# Patient Record
Sex: Female | Born: 1980 | Race: White | Hispanic: No | Marital: Married | Smoking: Current every day smoker
Health system: Southern US, Community
[De-identification: ages and names within clinical notes are randomized; demographics above are authoritative.]

## PROBLEM LIST (undated history)

## (undated) DIAGNOSIS — F99 Mental disorder, not otherwise specified: Secondary | ICD-10-CM

## (undated) DIAGNOSIS — F32A Depression, unspecified: Secondary | ICD-10-CM

## (undated) DIAGNOSIS — F329 Major depressive disorder, single episode, unspecified: Secondary | ICD-10-CM

## (undated) DIAGNOSIS — Z765 Malingerer [conscious simulation]: Secondary | ICD-10-CM

## (undated) DIAGNOSIS — G8929 Other chronic pain: Secondary | ICD-10-CM

## (undated) DIAGNOSIS — M549 Dorsalgia, unspecified: Secondary | ICD-10-CM

## (undated) DIAGNOSIS — F419 Anxiety disorder, unspecified: Secondary | ICD-10-CM

## (undated) HISTORY — PX: TUBAL LIGATION: SHX77

---

## 1999-01-07 ENCOUNTER — Emergency Department (HOSPITAL_COMMUNITY): Admission: EM | Admit: 1999-01-07 | Discharge: 1999-01-07 | Payer: Self-pay

## 1999-05-09 ENCOUNTER — Other Ambulatory Visit: Admission: RE | Admit: 1999-05-09 | Discharge: 1999-05-09 | Payer: Self-pay | Admitting: Obstetrics and Gynecology

## 1999-05-22 ENCOUNTER — Encounter: Payer: Self-pay | Admitting: Obstetrics and Gynecology

## 1999-05-22 ENCOUNTER — Ambulatory Visit (HOSPITAL_COMMUNITY): Admission: RE | Admit: 1999-05-22 | Discharge: 1999-05-22 | Payer: Self-pay | Admitting: Obstetrics and Gynecology

## 1999-05-25 ENCOUNTER — Inpatient Hospital Stay (HOSPITAL_COMMUNITY): Admission: AD | Admit: 1999-05-25 | Discharge: 1999-05-25 | Payer: Self-pay | Admitting: Obstetrics & Gynecology

## 1999-05-28 ENCOUNTER — Inpatient Hospital Stay (HOSPITAL_COMMUNITY): Admission: AD | Admit: 1999-05-28 | Discharge: 1999-05-28 | Payer: Self-pay | Admitting: Obstetrics and Gynecology

## 1999-05-29 ENCOUNTER — Encounter: Payer: Self-pay | Admitting: *Deleted

## 1999-05-29 ENCOUNTER — Inpatient Hospital Stay (HOSPITAL_COMMUNITY): Admission: RE | Admit: 1999-05-29 | Discharge: 1999-05-29 | Payer: Self-pay | Admitting: *Deleted

## 1999-05-30 ENCOUNTER — Ambulatory Visit (HOSPITAL_COMMUNITY): Admission: RE | Admit: 1999-05-30 | Discharge: 1999-05-30 | Payer: Self-pay | Admitting: *Deleted

## 1999-05-30 ENCOUNTER — Encounter: Payer: Self-pay | Admitting: *Deleted

## 1999-06-28 ENCOUNTER — Ambulatory Visit (HOSPITAL_COMMUNITY): Admission: RE | Admit: 1999-06-28 | Discharge: 1999-06-28 | Payer: Self-pay | Admitting: Obstetrics and Gynecology

## 1999-06-28 ENCOUNTER — Encounter: Payer: Self-pay | Admitting: Obstetrics and Gynecology

## 1999-07-07 ENCOUNTER — Inpatient Hospital Stay (HOSPITAL_COMMUNITY): Admission: AD | Admit: 1999-07-07 | Discharge: 1999-07-07 | Payer: Self-pay | Admitting: Obstetrics and Gynecology

## 1999-07-25 ENCOUNTER — Ambulatory Visit (HOSPITAL_COMMUNITY): Admission: RE | Admit: 1999-07-25 | Discharge: 1999-07-25 | Payer: Self-pay | Admitting: Obstetrics and Gynecology

## 1999-07-25 ENCOUNTER — Encounter: Payer: Self-pay | Admitting: Obstetrics and Gynecology

## 1999-08-08 ENCOUNTER — Inpatient Hospital Stay (HOSPITAL_COMMUNITY): Admission: AD | Admit: 1999-08-08 | Discharge: 1999-08-08 | Payer: Self-pay | Admitting: Obstetrics and Gynecology

## 1999-08-20 ENCOUNTER — Encounter (INDEPENDENT_AMBULATORY_CARE_PROVIDER_SITE_OTHER): Payer: Self-pay | Admitting: Specialist

## 1999-08-20 ENCOUNTER — Inpatient Hospital Stay (HOSPITAL_COMMUNITY): Admission: AD | Admit: 1999-08-20 | Discharge: 1999-08-22 | Payer: Self-pay | Admitting: Obstetrics and Gynecology

## 2000-02-29 ENCOUNTER — Emergency Department (HOSPITAL_COMMUNITY): Admission: EM | Admit: 2000-02-29 | Discharge: 2000-02-29 | Payer: Self-pay | Admitting: Emergency Medicine

## 2000-02-29 ENCOUNTER — Encounter: Payer: Self-pay | Admitting: Emergency Medicine

## 2000-05-09 ENCOUNTER — Emergency Department (HOSPITAL_COMMUNITY): Admission: EM | Admit: 2000-05-09 | Discharge: 2000-05-09 | Payer: Self-pay | Admitting: Emergency Medicine

## 2000-05-09 ENCOUNTER — Encounter: Payer: Self-pay | Admitting: Emergency Medicine

## 2000-07-08 ENCOUNTER — Emergency Department (HOSPITAL_COMMUNITY): Admission: EM | Admit: 2000-07-08 | Discharge: 2000-07-08 | Payer: Self-pay | Admitting: Emergency Medicine

## 2000-11-28 ENCOUNTER — Emergency Department (HOSPITAL_COMMUNITY): Admission: EM | Admit: 2000-11-28 | Discharge: 2000-11-28 | Payer: Self-pay | Admitting: Emergency Medicine

## 2000-11-28 ENCOUNTER — Encounter: Payer: Self-pay | Admitting: Emergency Medicine

## 2001-08-08 ENCOUNTER — Encounter: Payer: Self-pay | Admitting: Emergency Medicine

## 2001-08-08 ENCOUNTER — Emergency Department (HOSPITAL_COMMUNITY): Admission: EM | Admit: 2001-08-08 | Discharge: 2001-08-08 | Payer: Self-pay | Admitting: Emergency Medicine

## 2004-12-16 ENCOUNTER — Emergency Department (HOSPITAL_COMMUNITY): Admission: EM | Admit: 2004-12-16 | Discharge: 2004-12-16 | Payer: Self-pay | Admitting: Emergency Medicine

## 2009-07-26 ENCOUNTER — Emergency Department (HOSPITAL_COMMUNITY): Admission: EM | Admit: 2009-07-26 | Discharge: 2009-07-26 | Payer: Self-pay | Admitting: Emergency Medicine

## 2010-09-26 ENCOUNTER — Emergency Department (HOSPITAL_COMMUNITY)
Admission: EM | Admit: 2010-09-26 | Discharge: 2010-09-26 | Disposition: A | Discharge status: Home / Self Care | Payer: Self-pay | Attending: Emergency Medicine | Admitting: Emergency Medicine

## 2010-09-26 DIAGNOSIS — R509 Fever, unspecified: Secondary | ICD-10-CM | POA: Insufficient documentation

## 2010-09-26 DIAGNOSIS — K029 Dental caries, unspecified: Secondary | ICD-10-CM | POA: Insufficient documentation

## 2010-09-26 DIAGNOSIS — K089 Disorder of teeth and supporting structures, unspecified: Secondary | ICD-10-CM | POA: Insufficient documentation

## 2010-09-26 DIAGNOSIS — K047 Periapical abscess without sinus: Secondary | ICD-10-CM | POA: Insufficient documentation

## 2010-09-26 DIAGNOSIS — J45909 Unspecified asthma, uncomplicated: Secondary | ICD-10-CM | POA: Insufficient documentation

## 2010-09-26 DIAGNOSIS — R599 Enlarged lymph nodes, unspecified: Secondary | ICD-10-CM | POA: Insufficient documentation

## 2010-09-28 NOTE — Discharge Summary (Signed)
[facility]  Patient:    Alexis Kane, Alexis Kane                   MRN: [id number] Adm. Date:  [id number] Disc. Date: [id number] Attending:  Michaele Offer                           Discharge Summary  ADMISSION DIAGNOSES:          1.  Intrauterine pregnancy at 39 weeks.                               2.  Breech presentation.                               3.  Fetal anomalies.  DISCHARGE DIAGNOSES:          1.  Intrauterine pregnancy at 39 weeks.                               2.  Breech presentation.                               3.  Fetal anomalies.  PROCEDURES PERFORMED:         Primary low-transverse cesarean section without extensions.  COMPLICATIONS:                None.  CONSULTATIONS:                Neonatal intensive care unit.  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 Prenatal care was complicated by the above mentioned fetal anomalies noted by ultrasound in a normal 97 XY karyotype by amniocentesis.  The remainder of her prenatal care was negative.  Prenatal laboratory data is in the history and physical.  PAST MEDICAL HISTORY:         Significant for in 1997 vaginal delivery of a female infant that weighed 8 pounds 1 ounce, which was complicated by preterm labor.  She also had a vaginal delivery in 1999, of 7 pounds 2 ounce which was also complicated by preterm labor.  In February 2000, she had vaginal delivery at 37 weeks of 5 pounds 14 ounce which was also complicated by preterm labor. The remainder of her history is noncontributory, except for a social history which involves the patient and the father  of the baby not getting along very well and having a very difficult housing situation.  PHYSICAL EXAMINATION:  VITAL SIGNS:                  Significant for weight of 149 pounds.  ABDOMEN:                      Benign abdomen, with fundal height of 37 cm and estimated fetal weight of 7.5 pounds.  VAGINAL EXAM:  Three centimeters, 80% effaced, and high presenting part.  HOSPITAL COURSE:              The patient was admitted on the day of surgery and underwent a primary low-transverse cesarean section under spinal anesthesia.  Estimated blood loss was 600 cc and she delivered a viable female with Apgars of 8 and 8, which weighed 9 pounds 1 ounce and had no gross abnormalities and cried spontaneously.  Postpartum, the patient did very well.  Initially, she had a temperature up to 101.2, without any evidence of infection.  Her incision had some serosanguineous drainage.  Hemoglobin fell from 8 preoperatively to 7.3 postoperatively.  On the evening of postoperative day #2, the patient had remained afebrile, was ambulating and tolerating regular diet and requested discharge home.  She had had a meeting in the neonatal intensive care unit to discuss the baby. 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  CONDITION ON DISCHARGE:       Stable.  DISPOSITION:                  Discharged to home.  DISCHARGE INSTRUCTIONS:       Diet:  Regular.  Activity:  Pelvic rest.  No strenuous activity.  No driving.  Followup:  Her follow-up appointment is in two days for suture removal.  She was given our discharge summary pamphlet.  DISCHARGE MEDICATIONS:        Percocet p.r.n. pain. DD:  09/07/99 TD:  09/09/99 Job: [id number]

## 2010-09-28 NOTE — H&P (Signed)
[facility]  Patient:    Alexis Kane, Alexis Kane                   MRN: [id number] Adm. Date:  [id number] Attending:  Michaele Offer                         History and Physical  CHIEF COMPLAINT:              Here for cesarean section for breech presentation.  HISTORY OF PRESENT ILLNESS:   This is a 30 year old white female, gravida 4, para 2-0-1-2, with an EGA of [redacted] weeks by a 26-week ultrasound with an EDC of August 25, 1999, who presents for a primary elective cesarean section due to persistent breech presentation and fetal hydrocephalus.  Prenatal care was complicated by late prenatal care which was not received until late second trimester.  Her first ultrasound was performed at 26 weeks and revealed fetal hydrocephalus, absent left kidney, and possibility of lobar holoprosencephaly.  The patient met with Conni Elliot, M.D. and had an amniocentesis performed which revealed normal 46XY karyotype.  She also had a fetal echocardiogram performed which was normal. Repeat ultrasound on February 15, revealed dilation of the lateral ventricles and third ventricles.  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  The thallami were not fused.  The left kidney was still found to be absent and the baby was in breech presentation at that time with a normal amniotic fluid index and appropriate growth.  She was also treated with iron for anemia nd given Macrobid for a urinary tract infection.  Most recent examination in the office was on March 28, when she complained of leaking some fluid.  At that time her cervix was 2, 20, -4 with a breech presentation.  Due to her breech presentation, when she was seen earlier on March 28, she was scheduled for an elective cesarean section if she did not go into labor before then.  The risks f surgery have been discussed with the patient and she was not offered  an external cephalic version due to the fetal abnormality and she is admitted today for primary cesarean section.  PRENATAL LABORATORY DATA:     Blood type is A negative with a negative antibody  screen and she did receive RhoGAM with the pregnancy.  RPR is nonreactive, rubella immune, hepatitis B surface antigen negative, HIV negative, gonorrhea and Chlamydia were negative.  Hemoglobin in the late second trimester was 9.2.  Group B Strep is negative.  PAST OBSTETRIC HISTORY:       In 1997, vaginal delivery at questionable gestational age of a viable female that weighed 8 pounds 1 ounce.  She had preterm labor in February of 1999, vaginal delivery at approximately 36 weeks 7 pounds 2 ounces ith preterm labor.  In February of 2000, vaginal delivery at 37 weeks, 5 pounds 14 ounces with preterm labor.  Again all three pregnancies were complicated by anemia and preterm labor.  PAST MEDICAL HISTORY:         Negative.  PAST SURGICAL HISTORY:        Negative.  ALLERGIES:                    No known drug allergies.  MEDICATIONS:                  1. Chromagen one p.o. q.d.  SOCIAL  HISTORY:               The patient is single.  The father of the baby is  involved, but apparently she and the father of the baby do not get along well at all times.  She has a ninth grade education.  FAMILY HISTORY:               Noncontributory.  REVIEW OF SYSTEMS:            Negative.  PHYSICAL EXAMINATION:  VITAL SIGNS:                  Last weight in the office is 149 pounds.  Last blood pressure was 130/80.  GENERAL:                      She is a well-developed, well-nourished white female who is in no acute distress.  HEENT:                        Pupils are equally round and reactive to light and accommodation.  Extraocular muscles are intact.  Oropharynx is clear without erythema or exudates.  She does have poor dentition.  NECK:                         Supple without lymphadenopathy  or thyromegaly.  LUNGS:                        Clear to auscultation.  HEART:                        Regular rate and rhythm without murmurs.  ABDOMEN:                      Gravid and nontender with a fundal height of 37 cm and an estimated fetal weight of 7-1/2 pounds.  EXTREMITIES:                  Have trace edema, are nontender, and DTRs are 2 out of 4 and symmetric.  PELVIC:                       On my last vaginal examination she was 3 cm, 80%, and high, but an examination performed by Malachi Pro. Henley, M.D. was 2 cm, 20%, and -4, and she does have a persistent breech presentation.  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  She has had an amniocentesis performed which reveals 46XY karyotype and a normal fetal echocardiogram.  She has met with J. Alphonsa Gin, M.D. to discuss this situation and has also met with Conni Elliot, M.D.  She does not appear to have a very good understanding of what is  going on.  However, due to the persistent breech presentation and the fetal abnormality, she has been offered a primary cesarean section.  The risks including bleeding, infection, and damage to the surrounding organs have been discussed with the patient and she appears to understand.  The plan is for a primary cesarean section and have the neonatal team evaluate the baby after delivery. DD:  08/20/99 TD:  08/20/99 Job: [id number]

## 2010-09-28 NOTE — Op Note (Signed)
[facility]  Patient:    Alexis Kane, Alexis Kane                   MRN: [id number] Proc. Date: 08/20/99 Adm. 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  OPERATION:                    Primary low transverse cesarean section without extensions.  SURGEON:                      Zenaida Niece, M.D.  ASSISTANT:                    Alvino Chapel, M.D.  ANESTHESIA:                   Spinal.  ESTIMATED BLOOD LOSS:         600 cc.  CHEMOPROPHYLAXIS:             Ancef 1 gram after cord clamp.  FINDINGS:                     Normal female anatomy and delivery of a viable female infant with Apgars of 8 and 8 with a weight pending at the time of dictation. There were no gross abnormalities and the baby cried spontaneously.  COUNTS:                       Correct.  CONDITION:                    Stable.  DESCRIPTION OF PROCEDURE:     After appropriate informed consent was obtained, he patient was taken to the operating room and placed in a sitting position.  A spinal anesthesia was instilled by Roseanna Rainbow, M.D. and she was placed in the dorsal supine position with a left lateral tilt.  Her abdomen was prepped and draped in the usual sterile fashion and a Foley catheter inserted.  The level of her anesthesia was found to be adequate and her abdomen was entered via a standard Pfannenstiel incision.  The vesicouterine peritoneum was incised and a bladder lap created digitally.  The bladder blade was placed and the 4 cm transverse incision was made  in the lower uterine segment.   Once the endometrial cavity was entered, the incision was extended bilaterally and digitally.  Her membranes were ruptured revealing an adequate amount of clear amniotic fluid.  The fetal feet were at the incision and grasped and delivered through.  The fetus was delivered to the scapuli and had nuchal arms x 2.  With some difficulty the arms were able to be reduced and the head then delivered without much difficulty.  The mouth and nares were suctioned.  The cord was doubly clamped and cut and the infant handed to the awaiting pediatric team.  Cord blood and a segment of cord for gas were obtained and the placenta delivered spontaneously  and was sent for routine studies.  The  uterus was dried with a clean lap pad and found to be normal.  The uterine incision was inspected and found to be free of extensions.  The uterine incision was closed in one layer being a running locking layer with #1 chromic with adequate hemostasis.  Bleeding from serosal edges were controlled with electrocautery. oth tubes and ovaries were inspected and found to be normal.  The uterine incision as again inspected and found to be hemostatic.  The subfascial space was irrigated and made hemostatic with electrocautery.  The fascia was closed in a running fashion starting at both ends and meeting in the middle with 0 Vicryl.  The subcutaneous tissue was then irrigated and made hemostatic with electrocautery.  The skin was closed with a running subcuticular suture of 4-0 Prolene followed by Steri-Strips. The patient tolerated the procedure well and was taken to the recovery room in stable condition. DD:  08/20/99 TD:  08/20/99 Job: [id number]

## 2010-11-23 ENCOUNTER — Emergency Department (HOSPITAL_COMMUNITY)
Admission: EM | Admit: 2010-11-23 | Discharge: 2010-11-23 | Disposition: A | Discharge status: Home / Self Care | Payer: Self-pay | Attending: Emergency Medicine | Admitting: Emergency Medicine

## 2010-11-23 DIAGNOSIS — L298 Other pruritus: Secondary | ICD-10-CM | POA: Insufficient documentation

## 2010-11-23 DIAGNOSIS — J45909 Unspecified asthma, uncomplicated: Secondary | ICD-10-CM | POA: Insufficient documentation

## 2010-11-23 DIAGNOSIS — R350 Frequency of micturition: Secondary | ICD-10-CM | POA: Insufficient documentation

## 2010-11-23 DIAGNOSIS — L2989 Other pruritus: Secondary | ICD-10-CM | POA: Insufficient documentation

## 2010-11-23 DIAGNOSIS — R300 Dysuria: Secondary | ICD-10-CM | POA: Insufficient documentation

## 2010-11-23 DIAGNOSIS — R319 Hematuria, unspecified: Secondary | ICD-10-CM | POA: Insufficient documentation

## 2010-11-23 DIAGNOSIS — R109 Unspecified abdominal pain: Secondary | ICD-10-CM | POA: Insufficient documentation

## 2010-11-23 DIAGNOSIS — R21 Rash and other nonspecific skin eruption: Secondary | ICD-10-CM | POA: Insufficient documentation

## 2010-11-23 DIAGNOSIS — R110 Nausea: Secondary | ICD-10-CM | POA: Insufficient documentation

## 2010-11-23 DIAGNOSIS — H5789 Other specified disorders of eye and adnexa: Secondary | ICD-10-CM | POA: Insufficient documentation

## 2010-11-23 DIAGNOSIS — M545 Low back pain, unspecified: Secondary | ICD-10-CM | POA: Insufficient documentation

## 2010-11-23 LAB — CBC
HCT: 35.6 % — ABNORMAL LOW (ref 36.0–46.0)
Hemoglobin: 12.9 g/dL (ref 12.0–15.0)
MCHC: 36.2 g/dL — ABNORMAL HIGH (ref 30.0–36.0)

## 2010-11-23 LAB — DIFFERENTIAL
Basophils Absolute: 0 10*3/uL (ref 0.0–0.1)
Lymphocytes Relative: 37 % (ref 12–46)
Monocytes Absolute: 0.5 10*3/uL (ref 0.1–1.0)
Monocytes Relative: 6 % (ref 3–12)
Neutro Abs: 3.5 10*3/uL (ref 1.7–7.7)

## 2010-11-23 LAB — URINALYSIS, ROUTINE W REFLEX MICROSCOPIC
Bilirubin Urine: NEGATIVE
Glucose, UA: NEGATIVE mg/dL
Ketones, ur: NEGATIVE mg/dL
Leukocytes, UA: NEGATIVE
Nitrite: NEGATIVE
Protein, ur: NEGATIVE mg/dL
Specific Gravity, Urine: 1.016 (ref 1.005–1.030)
Urobilinogen, UA: 0.2 mg/dL (ref 0.0–1.0)
pH: 6.5 (ref 5.0–8.0)

## 2010-11-23 LAB — URINE MICROSCOPIC-ADD ON

## 2010-11-23 LAB — COMPREHENSIVE METABOLIC PANEL
Alkaline Phosphatase: 56 U/L (ref 39–117)
BUN: 10 mg/dL (ref 6–23)
CO2: 29 mEq/L (ref 19–32)
GFR calc Af Amer: >60 mL/min (ref >60)
GFR calc non Af Amer: >60 mL/min (ref >60)
Glucose, Bld: 87 mg/dL (ref 70–99)
Potassium: 4 mEq/L (ref 3.5–5.1)
Total Bilirubin: 0.1 mg/dL — ABNORMAL LOW (ref 0.3–1.2)
Total Protein: 6.1 g/dL (ref 6.0–8.3)

## 2010-11-23 LAB — CK: Total CK: 93 U/L (ref 7–177)

## 2010-11-23 LAB — POCT PREGNANCY, URINE: Preg Test, Ur: NEGATIVE

## 2011-02-28 ENCOUNTER — Inpatient Hospital Stay (HOSPITAL_COMMUNITY)
Admission: AD | Admit: 2011-02-28 | Discharge: 2011-02-28 | Disposition: A | Discharge status: Home / Self Care | Payer: Self-pay | Source: Ambulatory Visit | Attending: Family Medicine | Admitting: Family Medicine

## 2011-02-28 ENCOUNTER — Inpatient Hospital Stay (HOSPITAL_COMMUNITY): Payer: Self-pay

## 2011-02-28 ENCOUNTER — Encounter (HOSPITAL_COMMUNITY): Payer: Self-pay

## 2011-02-28 DIAGNOSIS — N912 Amenorrhea, unspecified: Secondary | ICD-10-CM | POA: Insufficient documentation

## 2011-02-28 DIAGNOSIS — R110 Nausea: Secondary | ICD-10-CM

## 2011-02-28 DIAGNOSIS — R109 Unspecified abdominal pain: Secondary | ICD-10-CM | POA: Insufficient documentation

## 2011-02-28 HISTORY — DX: Anxiety disorder, unspecified: F41.9

## 2011-02-28 HISTORY — DX: Depression, unspecified: F32.A

## 2011-02-28 HISTORY — DX: Mental disorder, not otherwise specified: F99

## 2011-02-28 HISTORY — DX: Major depressive disorder, single episode, unspecified: F32.9

## 2011-02-28 LAB — URINALYSIS, ROUTINE W REFLEX MICROSCOPIC
Glucose, UA: NEGATIVE mg/dL
Hgb urine dipstick: NEGATIVE
Leukocytes, UA: NEGATIVE
Specific Gravity, Urine: >1.03 — ABNORMAL HIGH (ref 1.005–1.030)
pH: 6 (ref 5.0–8.0)

## 2011-02-28 LAB — WET PREP, GENITAL: Yeast Wet Prep HPF POC: NONE SEEN

## 2011-02-28 LAB — CBC
MCH: 32 pg (ref 26.0–34.0)
MCHC: 35.4 g/dL (ref 30.0–36.0)
Platelets: 158 10*3/uL (ref 150–400)
RBC: 4.13 MIL/uL (ref 3.87–5.11)
RDW: 12.9 % (ref 11.5–15.5)

## 2011-02-28 NOTE — ED Provider Notes (Signed)
History     Chief Complaint  Patient presents with  . Abdominal Pain   HPI  Pt is not pregnant and presents with amenorrhea for 3 months along with abdominal pain and nausea and vomiting. Pt's pain is located in her right mid quadrant. Pt has asthma and gets her asthma medications from [facility] [place].  She saw a GYN last year in [place] and was told that she had an left ovarian cyst that should go away on its own.  Pt denies constipation, diarrhea, UTI symptoms or vaginal discharge.  Pt says her abdomen is getting bigger.    Past Medical History  Diagnosis Date  . Asthma   . Mental disorder   . Depression   . Anxiety     Past Surgical History  Procedure Date  . Cesarean section     No family history on file.  History  Substance Use Topics  . Smoking status: Current Everyday Smoker -- 1.0 packs/day  . Smokeless tobacco: Never Used  . Alcohol Use: No    Allergies: No Known Allergies  Prescriptions prior to admission  Medication Sig Dispense Refill  . albuterol (PROVENTIL HFA;VENTOLIN HFA) 108 (90 BASE) MCG/ACT inhaler Inhale 2 puffs into the lungs every 6 (six) hours as needed.        . budesonide-formoterol (SYMBICORT) 160-4.5 MCG/ACT inhaler Inhale 2 puffs into the lungs 2 (two) times daily.        Marland Kitchen ibuprofen (ADVIL,MOTRIN) 800 MG tablet Take 800 mg by mouth every 8 (eight) hours as needed.        . penicillin v potassium (VEETID) 500 MG tablet Take 500 mg by mouth 4 (four) times daily.          Review of Systems  Constitutional: Negative for fever and chills.  Gastrointestinal: Positive for nausea, vomiting and abdominal pain. Negative for diarrhea and constipation.   Physical Exam   Blood pressure 118/63, pulse 81, temperature 98.9 F (37.2 C), temperature source Oral, resp. rate 20, height 5\' 4"  (1.626 m), weight 135 lb (61.236 kg).  Physical Exam  Vitals reviewed. Constitutional: She is oriented to person, place, and time. She appears  well-developed.  HENT:  Head: Normocephalic.       Extensive dental carries on lower teeth- denies substance abuse- has not seen dentist in years  Eyes: Pupils are equal, round, and reactive to light.  Neck: Normal range of motion. Neck supple.  Cardiovascular: Normal rate.   Respiratory: Effort normal.  GI: Soft. Bowel sounds are normal. She exhibits no distension. There is tenderness. There is no rebound and no guarding.       protrubent soft abd without palpable masses  Genitourinary:       Vagina clean; cervix clean uterus deep in pelvis upper limit normal size; firm; right adnexal ?enlarged and tender, adnexa slightly tender- no rebound  Musculoskeletal: Normal range of motion.  Neurological: She is alert and oriented to person, place, and time.  Skin: Skin is warm and dry.  Psychiatric: She has a normal mood and affect.    MAU Course  Procedures Clinical Data: Pain  TRANSABDOMINAL AND TRANSVAGINAL ULTRASOUND OF PELVIS  Technique: Both transabdominal and transvaginal ultrasound  examinations of the pelvis were performed. Transabdominal technique  was performed for global imaging of the pelvis including uterus,  ovaries, adnexal regions, and pelvic cul-de-sac.  Comparison: None.  It was necessary to proceed with endovaginal exam following the  transabdominal exam to visualize the adnexa.  Findings: The  uterus is normal in size and echotexture. The  uterine dimensions are 8.6 x 4.7 x 5.4 cm. Endometrial stripe is  thin and homogeneous, measuring 11 mm in width.  Both ovaries have a normal size and appearance. The right ovary  measures 3.5 x 2.2 x 2.0 cm, and the left ovary measures 2.9 x 1.6  x 2.0 cm. There are no adnexal masses. Trace free pelvic fluid is  present.  IMPRESSION:  Normal pelvic ultrasound.     Assessment and Plan  Amenorrhea Abdominal pain Appointment to be scheduled at GYN clinic  [facility] 02/28/2011, 3:06 PM

## 2011-02-28 NOTE — Progress Notes (Signed)
LMP 3 months ago, neg home preg test last month.  Stomach keeps getting bigger.

## 2011-02-28 NOTE — Progress Notes (Signed)
Pain in rt upper abd - started today, is sharp

## 2011-02-28 NOTE — Progress Notes (Signed)
Pt states lower abd pain that began today, denies vag d/c changes or bleeding. Has frequency, denies burning or urgency.

## 2011-03-04 ENCOUNTER — Emergency Department (HOSPITAL_COMMUNITY)
Admission: EM | Admit: 2011-03-04 | Discharge: 2011-03-04 | Disposition: A | Discharge status: Home / Self Care | Payer: Self-pay | Attending: Emergency Medicine | Admitting: Emergency Medicine

## 2011-03-04 DIAGNOSIS — J45909 Unspecified asthma, uncomplicated: Secondary | ICD-10-CM | POA: Insufficient documentation

## 2011-03-04 DIAGNOSIS — K029 Dental caries, unspecified: Secondary | ICD-10-CM | POA: Insufficient documentation

## 2011-03-04 DIAGNOSIS — K089 Disorder of teeth and supporting structures, unspecified: Secondary | ICD-10-CM | POA: Insufficient documentation

## 2011-03-11 NOTE — ED Provider Notes (Signed)
Chart reviewed and agree with management and plan.  

## 2011-04-08 ENCOUNTER — Encounter: Payer: Self-pay | Admitting: Obstetrics and Gynecology

## 2011-07-03 ENCOUNTER — Encounter (HOSPITAL_COMMUNITY): Payer: Self-pay | Admitting: Emergency Medicine

## 2011-07-03 ENCOUNTER — Emergency Department (HOSPITAL_COMMUNITY)
Admission: EM | Admit: 2011-07-03 | Discharge: 2011-07-03 | Discharge status: Against Medical Advice | Payer: Self-pay | Attending: Emergency Medicine | Admitting: Emergency Medicine

## 2011-07-03 DIAGNOSIS — M549 Dorsalgia, unspecified: Secondary | ICD-10-CM | POA: Insufficient documentation

## 2011-07-03 NOTE — ED Notes (Signed)
Pt c/o lower back pain with radiation down right leg x 2 weeks

## 2011-07-03 NOTE — ED Notes (Signed)
Called patient's name a 3rd time (both in triage, waiting, and peds triage) -- no answer; patient left AMA without informing staff.

## 2011-07-03 NOTE — ED Notes (Signed)
Patient called in waiting room x2; no answer.  Will call a 3rd time.

## 2011-07-04 ENCOUNTER — Emergency Department (HOSPITAL_COMMUNITY)
Admission: EM | Admit: 2011-07-04 | Discharge: 2011-07-04 | Disposition: A | Discharge status: Home / Self Care | Payer: Self-pay | Attending: Emergency Medicine | Admitting: Emergency Medicine

## 2011-07-04 ENCOUNTER — Encounter (HOSPITAL_COMMUNITY): Payer: Self-pay

## 2011-07-04 ENCOUNTER — Emergency Department (HOSPITAL_COMMUNITY): Payer: Self-pay

## 2011-07-04 DIAGNOSIS — J45909 Unspecified asthma, uncomplicated: Secondary | ICD-10-CM | POA: Insufficient documentation

## 2011-07-04 DIAGNOSIS — M545 Low back pain, unspecified: Secondary | ICD-10-CM | POA: Insufficient documentation

## 2011-07-04 DIAGNOSIS — M25559 Pain in unspecified hip: Secondary | ICD-10-CM | POA: Insufficient documentation

## 2011-07-04 DIAGNOSIS — M79609 Pain in unspecified limb: Secondary | ICD-10-CM | POA: Insufficient documentation

## 2011-07-04 MED ORDER — CYCLOBENZAPRINE HCL 10 MG PO TABS: 10.0000 mg | ORAL_TABLET | Freq: Two times a day (BID) | ORAL | Status: AC | PRN

## 2011-07-04 MED ORDER — ACETAMINOPHEN 325 MG PO TABS
650.0000 mg | ORAL_TABLET | Freq: Once | ORAL | Status: AC
  Administered 2011-07-04: 650 mg via ORAL
  Filled 2011-07-04: qty 2

## 2011-07-04 MED ORDER — CYCLOBENZAPRINE HCL 10 MG PO TABS
5.0000 mg | ORAL_TABLET | Freq: Once | ORAL | Status: AC
  Administered 2011-07-04: 5 mg via ORAL
  Filled 2011-07-04: qty 2

## 2011-07-04 NOTE — ED Provider Notes (Signed)
History     CSN: [id number]  Arrival date & time 07/04/11  1057   First MD Initiated Contact with Patient 07/04/11 1144      Chief Complaint  Patient presents with  . Back Pain    (Consider location/radiation/quality/duration/timing/severity/associated sxs/prior treatment) HPI Patient complaining of low back pain began two weeks ago worsened two days ago.  States no injjury.  Pain radiates down left leg on left thig.  Worsens with nothing.  Throbs while laying.  Taking goody powder and ibuprofen without relief.   Past Medical History  Diagnosis Date  . Asthma   . Mental disorder   . Depression   . Anxiety     Past Surgical History  Procedure Date  . Cesarean section     History reviewed. No pertinent family history.  History  Substance Use Topics  . Smoking status: Current Everyday Smoker -- 1.0 packs/day  . Smokeless tobacco: Never Used  . Alcohol Use: No    OB History    Grav Para Term Preterm Abortions TAB SAB Ect Mult Living   6 6 0 6 0 0 0 0 2 6       Review of Systems  All other systems reviewed and are negative.    Allergies  Review of patient's allergies indicates no known allergies.  Home Medications   Current Outpatient Rx  Name Route Sig Dispense Refill  . ALBUTEROL SULFATE HFA 108 (90 BASE) MCG/ACT IN AERS Inhalation Inhale 2 puffs into the lungs every 6 (six) hours as needed. For wheezing     . GOODY HEADACHE PO Oral Take 1 packet by mouth 4 (four) times daily as needed. For pain    . BUDESONIDE-FORMOTEROL FUMARATE 160-4.5 MCG/ACT IN AERO Inhalation Inhale 2 puffs into the lungs 2 (two) times daily.      . IBUPROFEN 800 MG PO TABS Oral Take 800 mg by mouth every 8 (eight) hours as needed. For pain      BP 129/78  Pulse 83  Temp(Src) 97.5 F (36.4 C) (Oral)  Resp 16  SpO2 99%  LMP 06/30/2011  Physical Exam  Nursing note and vitals reviewed. Constitutional: She is oriented to person, place, and time. She appears well-developed and  well-nourished.  HENT:  Head: Normocephalic.  Eyes: EOM are normal. Pupils are equal, round, and reactive to light.  Neck: Normal range of motion. Neck supple.  Cardiovascular: Normal rate, regular rhythm and normal heart sounds.   Pulmonary/Chest: Effort normal and breath sounds normal.  Abdominal: Soft. Bowel sounds are normal.  Musculoskeletal: Normal range of motion.       Tender left lateral hip  Neurological: She is alert and oriented to person, place, and time.  Skin: Skin is warm and dry.  Psychiatric: She has a normal mood and affect.    ED Course  Procedures (including critical care time)  Labs Reviewed - No data to display No results found.   No diagnosis found.    MDM  Patient better here with Tylenol and Flexeril. Left hip x-rays negative. No neurological deficits are normal. Patient given prescription for Flexeril and advise follow up her primary care Dr.        Hilario Quarry, MD 07/04/11 [phone]

## 2011-07-04 NOTE — Discharge Instructions (Signed)
Hip Pain The hips join the upper legs to the lower pelvis. The bones, cartilage, tendons, and muscles of the hip joint perform a lot of work each day holding your body weight and allowing you to move around. Hip pain is a common symptom. It can range from a minor ache to severe pain on 1 or both hips. Pain may be felt on the inside of the hip joint near the groin, or the outside near the buttocks and upper thigh. There may be swelling or stiffness as well. It occurs more often when a person walks or performs activity. There are many reasons hip pain can develop. CAUSES  It is important to work with your caregiver to identify the cause since many conditions can impact the bones, cartilage, muscles, and tendons of the hips. Causes for hip pain include:  Broken (fractured) bones.   Separation of the thighbone from the hip socket (dislocation).   Torn cartilage of the hip joint.   Swelling (inflammation) of a tendon (tendonitis), the sac within the hip joint (bursitis), or a joint.   A weakening in the abdominal wall (hernia), affecting the nerves to the hip.   Arthritis in the hip joint or lining of the hip joint.   Pinched nerves in the back, hip, or upper thigh.   A bulging disc in the spine (herniated disc).   Rarely, bone infection or cancer.  DIAGNOSIS  The location of your hip pain will help your caregiver understand what may be causing the pain. A diagnosis is based on your medical history, your symptoms, results from your physical exam, and results from diagnostic tests. Diagnostic tests may include X-Elisse Pennick exams, a computerized magnetic scan (magnetic resonance imaging, MRI), or bone scan. TREATMENT  Treatment will depend on the cause of your hip pain. Treatment may include:  Limiting activities and resting until symptoms improve.   Crutches or other walking supports (a cane or brace).   Ice, elevation, and compression.   Physical therapy or home exercises.   Shoe inserts or  special shoes.   Losing weight.   Medications to reduce pain.   Undergoing surgery.  HOME CARE INSTRUCTIONS   Only take over-the-counter or prescription medicines for pain, discomfort, or fever as directed by your caregiver.   Put ice on the injured area:   Put ice in a plastic bag.   Place a towel between your skin and the bag.   Leave the ice on for 15 to 20 minutes at a time, 3 to 4 times a day.   Keep your leg raised (elevated) when possible to lessen swelling.   Avoid activities that cause pain.   Follow specific exercises as directed by your caregiver.   Sleep with a pillow between your legs on your most comfortable side.   Record how often you have hip pain, the location of the pain, and what it feels like. This information may be helpful to you and your caregiver.   Ask your caregiver about returning to work or sports and whether you should drive.   Follow up with your caregiver for further exams, therapy, or testing as directed.  SEEK MEDICAL CARE IF:   Your pain or swelling continues or worsens after 1 week.   You are feeling unwell or have chills.   You have increasing difficulty with walking.   You have a loss of sensation or other new symptoms.   You have questions or concerns.  SEEK IMMEDIATE MEDICAL CARE IF:   You   cannot put weight on the affected hip.   You have fallen.   You have a sudden increase in pain and swelling in your hip.   You have a fever.  MAKE SURE YOU:   Understand these instructions.   Will watch your condition.   Will get help right away if you are not doing well or get worse.  Document Released: 10/17/2009 Document Revised: 01/09/2011 Document Reviewed: 10/17/2009 [employer] Patient Information 2012 [employer]. 

## 2011-07-04 NOTE — ED Notes (Signed)
Pt denies any trauma. She states that she is having severe lower back pain with pain that is shooting to her left leg. No meds pta. Alert and oriented.

## 2014-03-14 ENCOUNTER — Encounter (HOSPITAL_COMMUNITY): Payer: Self-pay

## 2014-11-21 ENCOUNTER — Emergency Department (HOSPITAL_COMMUNITY)
Admission: EM | Admit: 2014-11-21 | Discharge: 2014-11-21 | Disposition: A | Discharge status: Home / Self Care | Payer: Self-pay | Attending: Emergency Medicine | Admitting: Emergency Medicine

## 2014-11-21 ENCOUNTER — Encounter (HOSPITAL_COMMUNITY): Payer: Self-pay | Admitting: *Deleted

## 2014-11-21 DIAGNOSIS — G8929 Other chronic pain: Secondary | ICD-10-CM | POA: Insufficient documentation

## 2014-11-21 DIAGNOSIS — M549 Dorsalgia, unspecified: Secondary | ICD-10-CM | POA: Insufficient documentation

## 2014-11-21 DIAGNOSIS — J45909 Unspecified asthma, uncomplicated: Secondary | ICD-10-CM | POA: Insufficient documentation

## 2014-11-21 DIAGNOSIS — R200 Anesthesia of skin: Secondary | ICD-10-CM | POA: Insufficient documentation

## 2014-11-21 DIAGNOSIS — Z720 Tobacco use: Secondary | ICD-10-CM | POA: Insufficient documentation

## 2014-11-21 DIAGNOSIS — M542 Cervicalgia: Secondary | ICD-10-CM | POA: Insufficient documentation

## 2014-11-21 DIAGNOSIS — Z8659 Personal history of other mental and behavioral disorders: Secondary | ICD-10-CM | POA: Insufficient documentation

## 2014-11-21 DIAGNOSIS — Z7951 Long term (current) use of inhaled steroids: Secondary | ICD-10-CM | POA: Insufficient documentation

## 2014-11-21 MED ORDER — OXYCODONE-ACETAMINOPHEN 5-325 MG PO TABS
1.0000 | ORAL_TABLET | Freq: Once | ORAL | Status: AC
  Administered 2014-11-21: 1 via ORAL
  Filled 2014-11-21: qty 1

## 2014-11-21 MED ORDER — CYCLOBENZAPRINE HCL 10 MG PO TABS: 10.0000 mg | ORAL_TABLET | Freq: Two times a day (BID) | ORAL | Status: DC | PRN

## 2014-11-21 MED ORDER — HYDROCODONE-ACETAMINOPHEN 10-325 MG PO TABS: 1.0000 | ORAL_TABLET | Freq: Four times a day (QID) | ORAL | Status: DC | PRN

## 2014-11-21 NOTE — Discharge Instructions (Signed)
norco for severe pain. Flexeril for spasms. Follow up with primary care doctor.   Chronic Back Pain  When back pain lasts longer than 3 months, it is called chronic back pain.People with chronic back pain often go through certain periods that are more intense (flare-ups).  CAUSES Chronic back pain can be caused by wear and tear (degeneration) on different structures in your back. These structures include:  The bones of your spine (vertebrae) and the joints surrounding your spinal cord and nerve roots (facets).  The strong, fibrous tissues that connect your vertebrae (ligaments). Degeneration of these structures may result in pressure on your nerves. This can lead to constant pain. HOME CARE INSTRUCTIONS  Avoid bending, heavy lifting, prolonged sitting, and activities which make the problem worse.  Take brief periods of rest throughout the day to reduce your pain. Lying down or standing usually is better than sitting while you are resting.  Take over-the-counter or prescription medicines only as directed by your caregiver. SEEK IMMEDIATE MEDICAL CARE IF:   You have weakness or numbness in one of your legs or feet.  You have trouble controlling your bladder or bowels.  You have nausea, vomiting, abdominal pain, shortness of breath, or fainting. Document Released: 06/06/2004 Document Revised: 07/22/2011 Document Reviewed: 04/13/2011 [facility] Patient Information 2015 [place], [place]. This information is not intended to replace advice given to you by your health care provider. Make sure you discuss any questions you have with your health care provider.   Back Exercises Back exercises help treat and prevent back injuries. The goal of back exercises is to increase the strength of your abdominal and back muscles and the flexibility of your back. These exercises should be started when you no longer have back pain. Back exercises include:  Pelvic Tilt. Lie on your back with your knees bent.  Tilt your pelvis until the lower part of your back is against the floor. Hold this position 5 to 10 sec and repeat 5 to 10 times.  Knee to Chest. Pull first 1 knee up against your chest and hold for 20 to 30 seconds, repeat this with the other knee, and then both knees. This may be done with the other leg straight or bent, whichever feels better.  Sit-Ups or Curl-Ups. Bend your knees 90 degrees. Start with tilting your pelvis, and do a partial, slow sit-up, lifting your trunk only 30 to 45 degrees off the floor. Take at least 2 to 3 seconds for each sit-up. Do not do sit-ups with your knees out straight. If partial sit-ups are difficult, simply do the above but with only tightening your abdominal muscles and holding it as directed.  Hip-Lift. Lie on your back with your knees flexed 90 degrees. Push down with your feet and shoulders as you raise your hips a couple inches off the floor; hold for 10 seconds, repeat 5 to 10 times.  Back arches. Lie on your stomach, propping yourself up on bent elbows. Slowly press on your hands, causing an arch in your low back. Repeat 3 to 5 times. Any initial stiffness and discomfort should lessen with repetition over time.  Shoulder-Lifts. Lie face down with arms beside your body. Keep hips and torso pressed to floor as you slowly lift your head and shoulders off the floor. Do not overdo your exercises, especially in the beginning. Exercises may cause you some mild back discomfort which lasts for a few minutes; however, if the pain is more severe, or lasts for more than 15 minutes,  do not continue exercises until you see your caregiver. Improvement with exercise therapy for back problems is slow.  See your caregivers for assistance with developing a proper back exercise program. Document Released: 06/06/2004 Document Revised: 07/22/2011 Document Reviewed: 02/28/2011 [facility] Patient Information 2015 [place], [place]. This information is not intended to replace advice  given to you by your health care provider. Make sure you discuss any questions you have with your health care provider.

## 2014-11-21 NOTE — ED Notes (Signed)
Declined W/C at D/C and was escorted to lobby by RN. 
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
## 2015-01-12 ENCOUNTER — Emergency Department (HOSPITAL_COMMUNITY)
Admission: EM | Admit: 2015-01-12 | Discharge: 2015-01-12 | Disposition: A | Discharge status: Home / Self Care | Payer: Self-pay | Attending: Emergency Medicine | Admitting: Emergency Medicine

## 2015-01-12 ENCOUNTER — Encounter (HOSPITAL_COMMUNITY): Payer: Self-pay | Admitting: Emergency Medicine

## 2015-01-12 DIAGNOSIS — Y9389 Activity, other specified: Secondary | ICD-10-CM | POA: Insufficient documentation

## 2015-01-12 DIAGNOSIS — Y9289 Other specified places as the place of occurrence of the external cause: Secondary | ICD-10-CM | POA: Insufficient documentation

## 2015-01-12 DIAGNOSIS — S0003XA Contusion of scalp, initial encounter: Secondary | ICD-10-CM | POA: Insufficient documentation

## 2015-01-12 DIAGNOSIS — Z79899 Other long term (current) drug therapy: Secondary | ICD-10-CM | POA: Insufficient documentation

## 2015-01-12 DIAGNOSIS — Y998 Other external cause status: Secondary | ICD-10-CM | POA: Insufficient documentation

## 2015-01-12 DIAGNOSIS — S300XXA Contusion of lower back and pelvis, initial encounter: Secondary | ICD-10-CM | POA: Insufficient documentation

## 2015-01-12 DIAGNOSIS — Z7951 Long term (current) use of inhaled steroids: Secondary | ICD-10-CM | POA: Insufficient documentation

## 2015-01-12 DIAGNOSIS — Z720 Tobacco use: Secondary | ICD-10-CM | POA: Insufficient documentation

## 2015-01-12 DIAGNOSIS — J45909 Unspecified asthma, uncomplicated: Secondary | ICD-10-CM | POA: Insufficient documentation

## 2015-01-12 DIAGNOSIS — Z8659 Personal history of other mental and behavioral disorders: Secondary | ICD-10-CM | POA: Insufficient documentation

## 2015-01-12 MED ORDER — TRAMADOL HCL 50 MG PO TABS: 50.0000 mg | ORAL_TABLET | Freq: Four times a day (QID) | ORAL | Status: DC | PRN

## 2015-01-12 MED ORDER — IBUPROFEN 400 MG PO TABS
400.0000 mg | ORAL_TABLET | Freq: Once | ORAL | Status: AC
  Administered 2015-01-12: 400 mg via ORAL
  Filled 2015-01-12: qty 1

## 2015-01-12 MED ORDER — OXYCODONE-ACETAMINOPHEN 5-325 MG PO TABS
1.0000 | ORAL_TABLET | Freq: Once | ORAL | Status: AC
  Administered 2015-01-12: 1 via ORAL
  Filled 2015-01-12: qty 1
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
## 2015-01-19 ENCOUNTER — Encounter (HOSPITAL_COMMUNITY): Payer: Self-pay | Admitting: Emergency Medicine

## 2015-01-19 ENCOUNTER — Emergency Department (HOSPITAL_COMMUNITY)
Admission: EM | Admit: 2015-01-19 | Discharge: 2015-01-19 | Disposition: A | Discharge status: Home / Self Care | Payer: BLUE CROSS/BLUE SHIELD | Attending: Emergency Medicine | Admitting: Emergency Medicine

## 2015-01-19 DIAGNOSIS — F329 Major depressive disorder, single episode, unspecified: Secondary | ICD-10-CM | POA: Insufficient documentation

## 2015-01-19 DIAGNOSIS — J45909 Unspecified asthma, uncomplicated: Secondary | ICD-10-CM | POA: Insufficient documentation

## 2015-01-19 DIAGNOSIS — Y998 Other external cause status: Secondary | ICD-10-CM | POA: Insufficient documentation

## 2015-01-19 DIAGNOSIS — Z720 Tobacco use: Secondary | ICD-10-CM | POA: Insufficient documentation

## 2015-01-19 DIAGNOSIS — M545 Low back pain, unspecified: Secondary | ICD-10-CM

## 2015-01-19 DIAGNOSIS — Z765 Malingerer [conscious simulation]: Secondary | ICD-10-CM

## 2015-01-19 DIAGNOSIS — Z79899 Other long term (current) drug therapy: Secondary | ICD-10-CM | POA: Insufficient documentation

## 2015-01-19 DIAGNOSIS — Y9389 Activity, other specified: Secondary | ICD-10-CM | POA: Insufficient documentation

## 2015-01-19 DIAGNOSIS — G8929 Other chronic pain: Secondary | ICD-10-CM | POA: Insufficient documentation

## 2015-01-19 DIAGNOSIS — S3992XA Unspecified injury of lower back, initial encounter: Secondary | ICD-10-CM | POA: Insufficient documentation

## 2015-01-19 DIAGNOSIS — Z7289 Other problems related to lifestyle: Secondary | ICD-10-CM | POA: Insufficient documentation

## 2015-01-19 DIAGNOSIS — F99 Mental disorder, not otherwise specified: Secondary | ICD-10-CM | POA: Insufficient documentation

## 2015-01-19 DIAGNOSIS — Z7951 Long term (current) use of inhaled steroids: Secondary | ICD-10-CM | POA: Insufficient documentation

## 2015-01-19 DIAGNOSIS — M549 Dorsalgia, unspecified: Secondary | ICD-10-CM

## 2015-01-19 DIAGNOSIS — F419 Anxiety disorder, unspecified: Secondary | ICD-10-CM | POA: Insufficient documentation

## 2015-01-19 DIAGNOSIS — W108XXA Fall (on) (from) other stairs and steps, initial encounter: Secondary | ICD-10-CM | POA: Insufficient documentation

## 2015-01-19 DIAGNOSIS — Y9289 Other specified places as the place of occurrence of the external cause: Secondary | ICD-10-CM | POA: Insufficient documentation

## 2015-01-19 HISTORY — DX: Malingerer (conscious simulation): Z76.5

## 2015-01-19 HISTORY — DX: Other chronic pain: G89.29

## 2015-01-19 HISTORY — DX: Dorsalgia, unspecified: M54.9

## 2015-01-19 NOTE — ED Provider Notes (Signed)
CSN: [id number]     Arrival date & time 01/19/15  1324 History  This chart was scribed for non-physician practitioner, Esmond Camper, PA-C working with No att. providers found by Angelene Giovanni, ED Scribe. The patient was seen in room TR08C/TR08C and the patient's care was started at 11:34 AM     Chief Complaint  Patient presents with  . Back Pain  . Fall   The history is provided by the patient. No language interpreter was used.   HPI Comments: Alexis Kane is a 34 y.o. female with a hx of chronic lower back pain who presents to the Emergency Department status post fall that occurred today when she fell backwards down the stairs. She reports that she no longer sees Dr. Wynelle Link but has an appointment to establish another PCP this month. She denies any loss of bladder control, fever, loss of distal sensation strength or function. Patient reports this is similar to previous back pain. She denies chest pain, shortness of breath.  Per DEA website, pt was given 120 Hydrocodones on 01/06/15 but pt denies getting them filled at a pharmacy.   Past Medical History  Diagnosis Date  . Asthma   . Mental disorder   . Depression   . Anxiety   . Chronic back pain   . Drug-seeking behavior    Past Surgical History  Procedure Laterality Date  . Cesarean section    . Tubal ligation     No family history on file. Social History  Substance Use Topics  . Smoking status: Current Every Day Smoker -- 1.00 packs/day  . Smokeless tobacco: Never Used  . Alcohol Use: No   OB History    Gravida Para Term Preterm AB TAB SAB Ectopic Multiple Living       Review of Systems  All other systems reviewed and are negative.     Allergies  Morphine and related  Home Medications   Prior to Admission medications   Medication Sig Start Date End Date Taking? Authorizing Provider  albuterol (PROVENTIL HFA;VENTOLIN HFA) 108 (90 BASE) MCG/ACT inhaler Inhale 2 puffs into the lungs every 6  (six) hours as needed. For wheezing     Historical Provider, MD  Aspirin-Acetaminophen-Caffeine (GOODY HEADACHE PO) Take 1 packet by mouth 4 (four) times daily as needed. For pain    Historical Provider, MD  budesonide-formoterol (SYMBICORT) 160-4.5 MCG/ACT inhaler Inhale 2 puffs into the lungs 2 (two) times daily.      Historical Provider, MD  cyclobenzaprine (FLEXERIL) 10 MG tablet Take 1 tablet (10 mg total) by mouth 2 (two) times daily as needed for muscle spasms. Patient not taking: Reported on 01/12/2015 11/21/14   Jaynie Crumble, PA-C  HYDROcodone-acetaminophen (NORCO) 10-325 MG per tablet Take 1 tablet by mouth every 6 (six) hours as needed. Patient not taking: Reported on 01/12/2015 11/21/14   Jaynie Crumble, PA-C  HYDROcodone-acetaminophen (NORCO/VICODIN) 5-325 MG per tablet Take 1 tablet by mouth every 6 (six) hours as needed for moderate pain.    Historical Provider, MD  ibuprofen (ADVIL,MOTRIN) 800 MG tablet Take 800 mg by mouth every 8 (eight) hours as needed. For pain    Historical Provider, MD  traMADol (ULTRAM) 50 MG tablet Take 1 tablet (50 mg total) by mouth every 6 (six) hours as needed. 01/12/15   Cathren Laine, MD   BP 98/77 mmHg  Pulse 80  Temp(Src) 98.5 F (36.9 C) (Oral)  Resp 16  Ht  5\' 4"  (1.626 m)  Wt 125 lb (56.7 kg)  BMI 21.45 kg/m2  SpO2 95%  LMP 01/02/2015 Physical Exam  Constitutional: She is oriented to person, place, and time. She appears well-developed and well-nourished. No distress.  HENT:  Head: Normocephalic.  Neck: Normal range of motion. Neck supple.  Cardiovascular: Normal rate, regular rhythm and normal heart sounds.   Pulmonary/Chest: Effort normal and breath sounds normal. She has no wheezes. She has no rales.  Abdominal: Soft. There is no tenderness. There is no rebound and no guarding.  Musculoskeletal: Normal range of motion. She exhibits tenderness. She exhibits no edema.  No C, T, or L spine tenderness to palpation. No obvious signs of  trauma, deformity, infection, step-offs. Lung expansion normal. No scoliosis or kyphosis. Bilateral lower extremity strength 5 out of 5, sensation grossly intact, pedal pulses 2+, Refill less than 3 seconds. Tenderness to palpation of the soft tissue of the lumbar area  Straight leg negative Ambulates with minimal difficulty   Neurological: She is alert and oriented to person, place, and time.  Skin: Skin is warm and dry. She is not diaphoretic.  Psychiatric: She has a normal mood and affect. Her behavior is normal. Judgment and thought content normal.  Nursing note and vitals reviewed.   ED Course  Procedures (including critical care time) DIAGNOSTIC STUDIES: Oxygen Saturation is 95% on RA, adequate by my interpretation.    COORDINATION OF CARE: 11:37 AM- Pt advised of plan for treatment and pt agrees.    Labs Review Labs Reviewed - No data to display  Imaging Review No results found. I have personally reviewed and evaluated these images and lab results as part of my medical decision-making.   EKG Interpretation None      MDM   Final diagnoses:  Bilateral low back pain without sciatica  Drug-seeking behavior  Labs:  Imaging:  Consults:  Therapeutics:  Discharge Meds:   Assessment/Plan: Patient presents with low back pain, she has no red flags or significant findings on exam that would necessitate further evaluation or management here in the ED. Patient denied preceding narcotic pain medication recently, I informed her that that [facility] database shows that she had 120 pills filled on 01/06/2015, patient adamantly denied this and tell husband arrived who reports that  maybe they called it in and they had not known. This seems very unlikely as she reports she saw him on 12/27/2014. Patient's presentation most likely represents drug-seeking behavior, she will be instructed to follow-up with primary care for further evaluation and management.  I personally performed the  services described in this documentation, which was scribed in my presence. The recorded information has been reviewed and is accurate.   Eyvonne Mechanic, PA-C 01/19/15 1708  Bethann Berkshire, MD 01/20/15 [phone]

## 2015-01-19 NOTE — Discharge Instructions (Signed)
Please follow-up with her primary care for further evaluation and management

## 2015-01-19 NOTE — ED Notes (Signed)
Declined W/C at D/C and was escorted to lobby by RN. 
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
## 2015-01-25 ENCOUNTER — Emergency Department (HOSPITAL_COMMUNITY): Payer: BLUE CROSS/BLUE SHIELD

## 2015-01-25 ENCOUNTER — Encounter (HOSPITAL_COMMUNITY): Payer: Self-pay | Admitting: Emergency Medicine

## 2015-01-25 ENCOUNTER — Emergency Department (HOSPITAL_COMMUNITY)
Admission: EM | Admit: 2015-01-25 | Discharge: 2015-01-26 | Disposition: A | Discharge status: Home / Self Care | Payer: BLUE CROSS/BLUE SHIELD | Attending: Emergency Medicine | Admitting: Emergency Medicine

## 2015-01-25 DIAGNOSIS — Y9389 Activity, other specified: Secondary | ICD-10-CM | POA: Diagnosis not present

## 2015-01-25 DIAGNOSIS — J45909 Unspecified asthma, uncomplicated: Secondary | ICD-10-CM | POA: Diagnosis not present

## 2015-01-25 DIAGNOSIS — Z720 Tobacco use: Secondary | ICD-10-CM | POA: Insufficient documentation

## 2015-01-25 DIAGNOSIS — F419 Anxiety disorder, unspecified: Secondary | ICD-10-CM | POA: Diagnosis not present

## 2015-01-25 DIAGNOSIS — G8929 Other chronic pain: Secondary | ICD-10-CM | POA: Diagnosis not present

## 2015-01-25 DIAGNOSIS — Z79899 Other long term (current) drug therapy: Secondary | ICD-10-CM | POA: Diagnosis not present

## 2015-01-25 DIAGNOSIS — S3992XA Unspecified injury of lower back, initial encounter: Secondary | ICD-10-CM | POA: Insufficient documentation

## 2015-01-25 DIAGNOSIS — S199XXA Unspecified injury of neck, initial encounter: Secondary | ICD-10-CM | POA: Insufficient documentation

## 2015-01-25 DIAGNOSIS — Y92410 Unspecified street and highway as the place of occurrence of the external cause: Secondary | ICD-10-CM | POA: Diagnosis not present

## 2015-01-25 DIAGNOSIS — F329 Major depressive disorder, single episode, unspecified: Secondary | ICD-10-CM | POA: Diagnosis not present

## 2015-01-25 DIAGNOSIS — Y998 Other external cause status: Secondary | ICD-10-CM | POA: Diagnosis not present

## 2015-01-25 MED ORDER — HYDROMORPHONE HCL 1 MG/ML IJ SOLN
1.0000 mg | Freq: Once | INTRAMUSCULAR | Status: AC
  Administered 2015-01-26: 1 mg via INTRAVENOUS
  Filled 2015-01-25: qty 1

## 2015-01-25 MED ORDER — CYCLOBENZAPRINE HCL 10 MG PO TABS
10.0000 mg | ORAL_TABLET | Freq: Once | ORAL | Status: AC
  Administered 2015-01-25: 10 mg via ORAL
  Filled 2015-01-25: qty 1

## 2015-01-25 MED ORDER — KETOROLAC TROMETHAMINE 15 MG/ML IJ SOLN
15.0000 mg | Freq: Once | INTRAMUSCULAR | Status: AC
  Administered 2015-01-26: 15 mg via INTRAVENOUS
  Filled 2015-01-25: qty 1

## 2015-01-25 MED ORDER — HYDROMORPHONE HCL 1 MG/ML IJ SOLN
1.0000 mg | Freq: Once | INTRAMUSCULAR | Status: AC
  Administered 2015-01-25: 1 mg via INTRAVENOUS
  Filled 2015-01-25: qty 1

## 2015-01-25 MED ORDER — ONDANSETRON HCL 4 MG/2ML IJ SOLN
4.0000 mg | Freq: Once | INTRAMUSCULAR | Status: AC
  Administered 2015-01-25: 4 mg via INTRAVENOUS
  Filled 2015-01-25: qty 2
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
## 2015-01-25 NOTE — ED Notes (Signed)
Patient transported to X-ray 

## 2015-01-25 NOTE — ED Notes (Signed)
Patient transported to CT 

## 2015-01-26 ENCOUNTER — Emergency Department (HOSPITAL_COMMUNITY): Payer: BLUE CROSS/BLUE SHIELD

## 2015-01-26 ENCOUNTER — Encounter (HOSPITAL_COMMUNITY): Payer: Self-pay | Admitting: Radiology

## 2015-01-26 DIAGNOSIS — S199XXA Unspecified injury of neck, initial encounter: Secondary | ICD-10-CM | POA: Diagnosis not present
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
## 2015-01-26 NOTE — ED Notes (Signed)
Patient transported to X-ray 

## 2015-05-10 ENCOUNTER — Encounter (HOSPITAL_COMMUNITY): Payer: Self-pay | Admitting: *Deleted

## 2015-05-10 ENCOUNTER — Emergency Department (HOSPITAL_COMMUNITY)
Admission: EM | Admit: 2015-05-10 | Discharge: 2015-05-10 | Disposition: A | Discharge status: Home / Self Care | Payer: BLUE CROSS/BLUE SHIELD | Attending: Emergency Medicine | Admitting: Emergency Medicine

## 2015-05-10 DIAGNOSIS — Z79899 Other long term (current) drug therapy: Secondary | ICD-10-CM | POA: Insufficient documentation

## 2015-05-10 DIAGNOSIS — K0889 Other specified disorders of teeth and supporting structures: Secondary | ICD-10-CM | POA: Insufficient documentation

## 2015-05-10 DIAGNOSIS — J45909 Unspecified asthma, uncomplicated: Secondary | ICD-10-CM | POA: Insufficient documentation

## 2015-05-10 DIAGNOSIS — G8929 Other chronic pain: Secondary | ICD-10-CM | POA: Insufficient documentation

## 2015-05-10 DIAGNOSIS — F17200 Nicotine dependence, unspecified, uncomplicated: Secondary | ICD-10-CM | POA: Insufficient documentation

## 2015-05-10 DIAGNOSIS — F329 Major depressive disorder, single episode, unspecified: Secondary | ICD-10-CM | POA: Insufficient documentation

## 2015-05-10 DIAGNOSIS — F419 Anxiety disorder, unspecified: Secondary | ICD-10-CM | POA: Insufficient documentation

## 2015-05-10 DIAGNOSIS — K029 Dental caries, unspecified: Secondary | ICD-10-CM | POA: Insufficient documentation

## 2015-05-10 MED ORDER — PENICILLIN V POTASSIUM 500 MG PO TABS: 500.0000 mg | ORAL_TABLET | Freq: Four times a day (QID) | ORAL | Status: AC

## 2015-05-10 MED ORDER — IBUPROFEN 400 MG PO TABS: 400.0000 mg | ORAL_TABLET | Freq: Four times a day (QID) | ORAL | Status: DC | PRN

## 2015-05-10 NOTE — Discharge Instructions (Signed)
Please take your antibiotics as prescribed. You may also use your ibuprofen as prescribed. Do not take the ibuprofen in addition to Goody's powders or other NSAIDs. Please follow-up with dentistry for definitive care.  [facility]  Community Service Learning [facility]  [address]  [place], [place] [address]  Phone [phone]  [facility] [employer] [facility] in [place], [place], exemplifies the [employer] vision to improve the health and quality of life of all [place]ians by Public house manager with a passion to care for the underserved and by leading the nation in community-based, service learning oral health education. We are committed to offering comprehensive general dental services for adults, children and special needs patients in a safe, caring and professional setting.  Appointments: Our clinic is open Monday through Friday 8:00 a.m. until 5:00 p.m. The amount of time scheduled for an appointment depends on the patients specific needs. We ask that you keep your appointed time for care or provide 24-hour notice of all appointment changes. Parents or legal guardians must accompany minor children.  Payment for Services: Medicaid and other insurance plans are welcome. Payment for services is due when services are rendered and may be made by cash or credit card. If you have dental insurance, we will assist you with your claim submission.   Emergencies: Emergency services will be provided Monday through Friday on a walk-in basis. Please arrive early for emergency services. After hours emergency services will be provided for patients of record as required.  Services:  Comprehensive General Dentistry  Childrens Dentistry  Oral Surgery - Extractions  Root Canals  Sealants and Tooth Colored Fillings  Crowns and Bridges  Dentures and Partial Dentures  Implant  Services  Periodontal Services and Agricultural engineer  3-D/Cone Beam Imaging   Emergency Department Resource Guide 1) Find a Doctor and Pay Out of Pocket Although you won't have to find out who is covered by your insurance plan, it is a good idea to ask around and get recommendations. You will then need to call the office and see if the doctor you have chosen will accept you as a new patient and what types of options they offer for patients who are self-pay. Some doctors offer discounts or will set up payment plans for their patients who do not have insurance, but you will need to ask so you aren't surprised when you get to your appointment.  2) Contact Your Local Health Department Not all health departments have doctors that can see patients for sick visits, but many do, so it is worth a call to see if yours does. If you don't know where your local health department is, you can check in your phone book. The CDC also has a tool to help you locate your state's health department, and many state websites also have listings of all of their local health departments.  3) Find a Walk-in Clinic If your illness is not likely to be very severe or complicated, you may want to try a walk in clinic. These are popping up all over the country in pharmacies, drugstores, and shopping centers. They're usually staffed by nurse practitioners or physician assistants that have been trained to treat common illnesses and complaints. They're usually fairly quick and inexpensive. However, if you have serious medical issues or chronic medical problems, these are probably not your best option.  No Primary Care Doctor: -  Call Health Connect at  [phone] - they can help you locate a primary care doctor that  accepts your insurance, provides certain services, etc. - Physician Referral Service- [phone]  Chronic Pain Problems: Organization         Address  Phone   Notes  [facility]  [phone] Patients need to be referred by their primary care doctor.   Medication Assistance: Organization         Address  Phone   Notes  [facility] Medication [facility] [address] [place], [place] [address] [phone] --Must be a resident of [facility] -- Must have NO insurance coverage whatsoever (no Medicaid/ Medicare, etc.) -- The pt. MUST have a primary care doctor that directs their care regularly and follows them in the community   Med[facility]ist  [phone]   [employer]  [phone]    Agencies that provide inexpensive medical care: Organization         Address  Phone   Notes  [facility]  [phone]   [facility]    [phone]   [facility] [address] [place], [place] [address] [phone]   [facility] [address], [place] [phone]   Planned Parenthood    [phone]   [facility]    [phone]   [facility]  [address], [place] Phone:  [phone], Fax:  [phone] Hours of Operation:  9 am - 6 pm, M-F.  Also accepts Medicaid/Medicare and self-pay.  [facility]  [address], [place] Phone: [phone], Fax: [phone]. Hours of Operation:  8:30 am - 5:30 pm, M-F.  Also accepts Medicaid and self-pay.  [facility] [place] [address], [place] Phone: [phone]   [facility] [address] Natasha Bence [place], [place] [phone], Ext. [phone] Mondays & Thursdays: 7-9 AM.  First 15 patients are seen on a first come, first serve basis.    Medicaid-accepting [facility] Providers:  Organization         Address  Phone   Notes  [facility] [address], [place] [phone] Also accepts self-pay patients.  [facility] [address] Laurell Josephs [place], [place]  [phone]   [facility] [address], [place] [phone]   [facility] [address], [place] [phone]   Renaye Rakers [address], [place]   [phone] Only accepts [place] Access [place] patients after they have their name applied to their card.   Self-Pay (no insurance) in [facility]:  Organization         Address  Phone   Notes  Sickle Cell Patients, [facility] [address] [place], [place] [phone]   [facility] [address] [place], [place] [phone]   [facility] [place]  [address], [place] [phone]   [facility]/Dr. Osei-Bonsu  [address], [place] or [address], [place] [phone] Phone number for both [place] and [place] locations is the same.  [facility] [address] [place] Otto [phone]   [facility] [place]  [address], [place] or [address] [phone]   [facility] [address] [place], [place] [phone], phone; [phone], fax Sees patients 1st and 3rd Saturday of every month.  Must not qualify for public or private insurance (i.e. Medicaid, Medicare, [place] Health Choice, Veterans' Benefits)  Household income should be no more than 200% of the poverty level The clinic cannot treat you if you are pregnant or think you are pregnant  Sexually transmitted diseases are not treated at the clinic.    Dental Care: Organization         Address  Phone  Notes  [facility] Department of [facility] [address] [place], [place] [phone] Accepts children up to age 26 who are enrolled in [place] or [place] Health Choice; pregnant women with a Medicaid card; and children who have applied for  Medicaid or [place] Health Choice, but were declined, whose parents can pay a reduced fee at time of service.  [facility] Department of [facility]  [address], [place] [phone] Accepts children up to age 31 who are enrolled in [place] or [place] Health Choice; pregnant women with a Medicaid card; and children who have applied for Medicaid or [place] Health Choice, but were declined, whose parents can pay a reduced fee at time of service.  [facility]  [address] [place], [place] [phone] Patients are seen by appointment only. Walk-ins are not accepted. [facility] will see patients 83 years of age and older. Monday - Tuesday (8am-5pm) Most Wednesdays (8:30-5pm) $30 per visit, cash only  [facility]  [address], [facility] [phone] Patients are seen by appointment only. Walk-ins are not accepted. [facility] will see patients 61 years of age and older. One Wednesday Evening (Monthly: Volunteer Based).  $30 per visit, cash only  [employer]  [phone] for adults; Children under age 35, call Graduate Pediatric Dentistry at [phone]. Children aged 29-14, please call [phone] to request a pediatric application.  Dental services are provided in all areas of dental care including fillings, crowns and bridges, complete and partial dentures, implants, gum treatment, root canals, and extractions. Preventive care is also provided. Treatment is provided to both adults and children. Patients are selected via a lottery and there is often a waiting list.   [facility] [address] [place]  [phone] [email or web address]   [facility] [address] [place], [place] [phone], Ext. [phone] Second and Fourth Thursday of each month, opens at 6:30 AM; Clinic ends at 9 AM.  Patients are seen on a first-come first-served basis, and a limited number  are seen during each clinic.   [facility]  [address] Ether Griffins [place], [place] [phone]   Eligibility Requirements You must have lived in [place], [place], or [place] counties for at least the last three months.   You cannot be eligible for state or federal sponsored [place], including [employer], [place], or [employer].   You generally cannot be eligible for healthcare insurance through your employer.    How to apply: Eligibility screenings are held every Tuesday and Wednesday afternoon from 1:00 pm until 4:00 pm. You do not need an appointment for the interview!  [facility] [address] [place], [place] [phone]   [facility]  Department  [phone]   [facility]  [phone]   [facility]  [phone]    Behavioral Health Resources in the Community: Intensive Outpatient Programs Organization         Address  Phone  Notes  [facility] [address], [place], [place] [phone]   [facility] [address], [place], [place] [phone]   [facility] [address], [place], [place]  [phone]   [facility] [address],  [place], [place] [phone] or [phone]   Substance Abuse Resources Organization         Address  Phone  Notes  [facility]  [phone]   [facility]  [phone]   [facility]  [phone]   [facility]  [phone]   Residential & Outpatient Substance Abuse Program  [phone]   Psychological Services Organization         Address  Phone  Notes  [facility]  [phone]- [phone]   [facility]  [phone]- [phone]   [facility] [address], [place] [phone] or [phone]    Mobile Crisis Teams Organization         Address  Phone  Notes  Therapeutic  Alternatives, Mobile Crisis Care Unit  [phone]   [facility]  [address] [place], [place] [phone]   Doristine LocksSharon DeEsch [address] [place] [phone]    Self-Help/Support Groups Organization         Address  Phone             Notes  [facility] [place] - variety of support groups  336- [phone] Call for more information  Narcotics Anonymous (NA), [facility] [address], [place]  2 meetings at this location   Statisticianesidential Treatment Programs Organization         Address  Phone  Notes  [facility] [address],    [place]  [phone]   [facility]  [address], [place] [phone], [place], [place] [phone]   [facility] [address], [place] [phone] Admissions: 8am-3pm M-F  [facility] [address]    [place], [place] [phone]   [facility] [address], [place], [place] [phone]   [facility] [address]  [place], [place] [phone]   [facility] - Intensive Outpatient [address], [place], [place] [phone]   [facility] ([facility] [address]  [place], [place] [phone] or [phone]   [facility] ([facility]) [address] [place], [place] [phone] Accepts Medicaid  [facility] [address]  [place] [phone] Substance Abuse/Addiction Treatment   [facility] Resources Organization         Address  Phone  Notes  [facility]  [phone]) [phone]   Angie FavaJulie Brannon, PhD [address], [address] [place], [place]   [phone] or [phone]   [facility]   [address] [place], [place] [phone]) [phone]   [facility] [address], [place], [place] [phone]) [phone] Insurance/Medicaid/sponsorship through [employer] [facility]  [facility]ith [facility] Families  [address]  [place], [place] [phone] [place], [place] [phone]    Dr. Adele Schilder  [phone]   [facility] Dept. 1) [address] [place] 2) [place] 3)  [address], [place] [phone]  [phone]   [place] [phone] or [phone] (After Hours)

## 2015-05-10 NOTE — ED Notes (Signed)
Pt reports rt lower tooth pain that started yesterday.

## 2015-05-10 NOTE — ED Provider Notes (Signed)
CSN: [id number]     Arrival date & time 05/10/15  1735 History  By signing my name below, I, Alexis Kane, attest that this documentation has been prepared under the direction and in the presence of [employer] MillsBenjamin Ivo Moga, PA-C. Electronically Signed: Phillis HaggisGabriella Kane, ED Scribe. 05/10/2015. 6:03 PM.   Chief Complaint  Patient presents with  . Dental Pain   The history is provided by the patient. No language interpreter was used.  HPI Comments: Alexis FordChristine R Kane is a 34 y.o. Female with a hx of drug seeking behavior who presents to the Emergency Department complaining of right lower dental pain onset one day ago. Pt reports taking ibuprofen, tylenol, goody powder, and hydrocodone 5 to no relief. She reports heat makes the pain better and eating makes the pain worse. She reports hx of similar symptoms but does not have a dentist. She denies fever, chills, trouble swallowing, or SOB.   Past Medical History  Diagnosis Date  . Asthma   . Mental disorder   . Depression   . Anxiety   . Chronic back pain   . Drug-seeking behavior    Past Surgical History  Procedure Laterality Date  . Cesarean section    . Tubal ligation     No family history on file. Social History  Substance Use Topics  . Smoking status: Current Every Day Smoker -- 1.00 packs/day  . Smokeless tobacco: Never Used  . Alcohol Use: No   OB History    Gravida Para Term Preterm AB TAB SAB Ectopic Multiple Living   6 6 0 6 0 0 0 0 2 6      Review of Systems  Constitutional: Negative for fever and chills.  HENT: Positive for dental problem. Negative for trouble swallowing.   Respiratory: Negative for shortness of breath.   All other systems reviewed and are negative.  Allergies  Morphine and Morphine and related  Home Medications   Prior to Admission medications   Medication Sig Start Date End Date Taking? Authorizing Provider  albuterol (PROVENTIL HFA;VENTOLIN HFA) 108 (90 BASE) MCG/ACT inhaler Inhale 2 puffs into the  lungs every 6 (six) hours as needed. For wheezing     Historical Provider, MD  budesonide-formoterol (SYMBICORT) 160-4.5 MCG/ACT inhaler Inhale 2 puffs into the lungs 2 (two) times daily.      Historical Provider, MD  clonazePAM (KLONOPIN) 1 MG tablet Take 1 mg by mouth 2 (two) times daily.    Historical Provider, MD  cyclobenzaprine (FLEXERIL) 10 MG tablet Take 1 tablet (10 mg total) by mouth 2 (two) times daily as needed for muscle spasms. Patient not taking: Reported on 01/12/2015 11/21/14   [name], PA-C  HYDROcodone-acetaminophen (NORCO) 10-325 MG per tablet Take 1 tablet by mouth every 6 (six) hours as needed. 11/21/14   [name], PA-C  ibuprofen (ADVIL,MOTRIN) 400 MG tablet Take 1 tablet (400 mg total) by mouth every 6 (six) hours as needed. 05/10/15   [name], PA-C  penicillin v potassium (VEETID) 500 MG tablet Take 1 tablet (500 mg total) by mouth 4 (four) times daily. 05/10/15 05/17/15  Joycie PeekBenjamin Kaison Mcparland, PA-C  traMADol (ULTRAM) 50 MG tablet Take 1 tablet (50 mg total) by mouth every 6 (six) hours as needed. Patient not taking: Reported on 01/25/2015 01/12/15   [name], MD   BP 112/77 mmHg  Pulse 89  Temp(Src) 98 F (36.7 C) (Oral)  Resp 16  SpO2 98% Physical Exam  Constitutional: She is oriented to person, place, and time. She  appears well-developed and well-nourished.  HENT:  Head: Normocephalic.  Mouth/Throat: Uvula is midline and oropharynx is clear and moist.  Discomfort located to right mandibular canine. Overall poor dentition. Multiple missing teeth with active caries. Mucous membranes are moist. No unilateral tonsillar swelling, uvula midline, no glossal swelling or elevation. No trismus. No fluctuance or evidence of a drainable abscess. No other evidence of emergent infection, Retropharyngeal or Peritonsillar abscess, Ludwig or Vincents angina. Tolerating secretions well. Patent airway   Eyes: EOM are normal.  Neck: Normal range of motion.   Cardiovascular: Normal rate, regular rhythm and normal heart sounds.   Pulmonary/Chest: Effort normal and breath sounds normal.  Abdominal: Soft. There is no tenderness.  Musculoskeletal: Normal range of motion.  Neurological: She is alert and oriented to person, place, and time.  Psychiatric: She has a normal mood and affect.  Nursing note and vitals reviewed.   ED Course  Procedures (including critical care time) DIAGNOSTIC STUDIES: Oxygen Saturation is 98% on RA, normal by my interpretation.    COORDINATION OF CARE: 6:01 PM-Discussed treatment plan which includes dental and anti-biotic with pt at bedside and pt agreed to plan.   NERVE BLOCK Performed by: Shanekia Latella W Consent: Verbal coSharlene MottsRequired items: required blood products, implants, devices, and special equipment available Time out: Immediately prior to procedure a "time out" was called to verify the correct patient, procedure, equipment, support staff and site/side marked as required.  Indication: Dental pain  Nerve block body site: Lower jaw   Preparation: Patient was prepped and draped in the usual sterile fashion. Needle gauge: 24 G Location technique: anatomical landmarks  Local anesthetic: Bupivacaine   Anesthetic total: 1.8 ml  Outcome: pain improved Patient tolerance: Patient tolerated the procedure well with no immediate complications.   Labs Review Labs Reviewed - No data to display  Imaging Review No results found. I have personally reviewed and evaluated these images and lab results as part of my medical decision-making.   EKG Interpretation None     Medications - No data to display Filed Vitals:   05/10/15 1751  BP: 112/77  Pulse: 89  Temp: 98 F (36.7 C)  Resp: 16   Meds given in ED:  Medications - No data to display  New Prescriptions   IBUPROFEN (ADVIL,MOTRIN) 400 MG TABLET    Take 1 tablet (400 mg total) by mouth every 6 (six) hours as needed.   PENICILLIN  V POTASSIUM (VEETID) 500 MG TABLET    Take 1 tablet (500 mg total) by mouth 4 (four) times daily.    MDM  Here for evaluation of dental pain. On exam, there is no evidence of a drainable abscess. No trismus, glossal elevation, unilateral tonsillar swelling. No evidence of retropharyngeal or peritonsillar abscess or Ludwig angina. Patient received a dental block in the ED and experiences relief. Discharged with outpatient dental resources. Also given prescription for anti-inflammatories and antibiotics. Overall, appears well, nontoxic and appropriate for discharge.  The patient appears reasonably screened and/or stabilized for discharge and I doubt any other medical condition or other [facility] requiring further screening, evaluation, or treatment in the ED at this time prior to discharge.    Final diagnoses:  Pain, dental    I personally performed the services described in this documentation, which was scribed in my presence. The recorded information has been reviewed and is accurate.    Joycie Peek, PA-C 05/10/15 1812  Rolland Porter, MD 05/19/15 [phone]

## 2016-03-06 ENCOUNTER — Ambulatory Visit (INDEPENDENT_AMBULATORY_CARE_PROVIDER_SITE_OTHER): Payer: BLUE CROSS/BLUE SHIELD | Admitting: Medical

## 2016-03-06 ENCOUNTER — Encounter: Payer: Self-pay | Admitting: Medical

## 2016-03-06 VITALS — BP 104/62 | HR 95 | Temp 98.0°F | Ht 64.0 in | Wt 124.4 lb

## 2016-03-06 DIAGNOSIS — F4323 Adjustment disorder with mixed anxiety and depressed mood: Secondary | ICD-10-CM | POA: Diagnosis not present

## 2016-03-06 DIAGNOSIS — M5441 Lumbago with sciatica, right side: Secondary | ICD-10-CM

## 2016-03-06 DIAGNOSIS — F329 Major depressive disorder, single episode, unspecified: Secondary | ICD-10-CM

## 2016-03-06 DIAGNOSIS — G8929 Other chronic pain: Secondary | ICD-10-CM

## 2016-03-06 DIAGNOSIS — J45909 Unspecified asthma, uncomplicated: Secondary | ICD-10-CM | POA: Diagnosis not present

## 2016-03-06 DIAGNOSIS — F32A Depression, unspecified: Secondary | ICD-10-CM

## 2016-03-06 DIAGNOSIS — J209 Acute bronchitis, unspecified: Secondary | ICD-10-CM

## 2016-03-06 MED ORDER — GABAPENTIN 100 MG PO CAPS: 100.0000 mg | ORAL_CAPSULE | Freq: Three times a day (TID) | ORAL | 1 refills | Status: DC

## 2016-03-06 MED ORDER — KETOROLAC TROMETHAMINE 60 MG/2ML IM SOLN
60.0000 mg | Freq: Once | INTRAMUSCULAR | Status: AC
  Administered 2016-03-06: 60 mg via INTRAMUSCULAR

## 2016-03-06 MED ORDER — DICLOFENAC SODIUM 75 MG PO TBEC: 75.0000 mg | DELAYED_RELEASE_TABLET | Freq: Two times a day (BID) | ORAL | 0 refills | Status: DC

## 2016-03-06 MED ORDER — ALBUTEROL SULFATE HFA 108 (90 BASE) MCG/ACT IN AERS: 2.0000 | INHALATION_SPRAY | Freq: Four times a day (QID) | RESPIRATORY_TRACT | 2 refills | Status: AC | PRN

## 2016-03-06 MED ORDER — BUDESONIDE-FORMOTEROL FUMARATE 80-4.5 MCG/ACT IN AERO: 2.0000 | INHALATION_SPRAY | Freq: Two times a day (BID) | RESPIRATORY_TRACT | 3 refills | Status: AC

## 2016-03-06 MED ORDER — AZITHROMYCIN 250 MG PO TABS: ORAL_TABLET | ORAL | 0 refills | Status: DC

## 2016-03-06 NOTE — Progress Notes (Signed)
Subjective:    Patient ID: Alexis Kane, female    DOB: 1980-10-13, 35 y.o.   MRN: [id number]  HPI    I have reviewed pt PMH, PSH, FH, Social History and Surgical History  Pt in states she is in a lot of pain. She states she is a lot of pain. Pt had back pain for 5 years. Pt former practice is closing. Pt states told former work up shows she had 2 bulging disc.   Pt states out of all her out of her clonopin, and  norco . In past tramadol did not help. Pt been out of both clonopin and norco for 10 days. (in chart problem list states drug seeking behavior??)  Pt location of back pain is constant lower back pain. Sometimes has pain shoots to her legs. No leg weakness, no saddles anesthesia, no incontinence or foot drop. No incontinence. Pt states gabapentin did help with her back pain. Pt also has hx of using muscle relaxants.  LMP- Pt last menstrual cycle was about 5 days. Normal came when she expected it to.  Pt has some asthma. Some wheezing recently. Some mild productive cough. Pt does have some albuterol. She has some symbicort in the past but has run out. Pt does smoke a pack a day.  Pt takes the clonopin for anxiety, depression and states bipolar history. Pt used to see psychiatrist. Has been a long time. No regular exercise, Admits not eating healthy,   Pt was working at [employer] 4 months ago. May restart working soon, Pt had 5 pregnancies at 4 children.(last pregnancy had twins).       Review of Systems  Constitutional: Negative for chills, fatigue and fever.  Respiratory: Negative for cough, chest tightness, shortness of breath and wheezing.   Cardiovascular: Negative for chest pain and palpitations.  Genitourinary: Negative for dysuria, flank pain, frequency, hematuria, pelvic pain and urgency.  Musculoskeletal: Positive for back pain. Negative for arthralgias, gait problem, joint swelling, myalgias and neck stiffness.  Skin: Negative for pallor.  Neurological:  Negative for dizziness and headaches.  Hematological: Negative for adenopathy. Does not bruise/bleed easily.  Psychiatric/Behavioral: Negative for behavioral problems, confusion, self-injury and suicidal ideas. The patient is nervous/anxious.        Mild anxious now. Does not report severe. Affect good presently.    Past Medical History:  Diagnosis Date  . Anxiety   . Asthma   . Chronic back pain   . Depression   . Drug-seeking behavior   . Mental disorder      Social History   Social History  . Marital status: Married    Spouse name: N/A  . Number of children: N/A  . Years of education: N/A   Occupational History  . Not on file.   Social History Main Topics  . Smoking status: Current Every Day Smoker    Packs/day: 1.00  . Smokeless tobacco: Never Used  . Alcohol use No  . Drug use: No  . Sexual activity: Yes    Birth control/ protection: None   Other Topics Concern  . Not on file   Social History Narrative  . No narrative on file    Past Surgical History:  Procedure Laterality Date  . CESAREAN SECTION    . TUBAL LIGATION      History reviewed. No pertinent family history.  No Active Allergies  Current Outpatient Prescriptions on File Prior to Visit  Medication Sig Dispense Refill  . albuterol (PROVENTIL HFA;VENTOLIN  HFA) 108 (90 BASE) MCG/ACT inhaler Inhale 2 puffs into the lungs every 6 (six) hours as needed. For wheezing     . budesonide-formoterol (SYMBICORT) 160-4.5 MCG/ACT inhaler Inhale 2 puffs into the lungs 2 (two) times daily.      . cyclobenzaprine (FLEXERIL) 10 MG tablet Take 1 tablet (10 mg total) by mouth 2 (two) times daily as needed for muscle spasms. (Patient not taking: Reported on 01/12/2015) 20 tablet 0  . HYDROcodone-acetaminophen (NORCO) 10-325 MG per tablet Take 1 tablet by mouth every 6 (six) hours as needed. 6 tablet 0   No current facility-administered medications on file prior to visit.     BP 104/62 (BP Location: Right Arm,  Patient Position: Sitting)   Pulse 95   Temp 98 F (36.7 C) (Oral)   Ht 5\' 4"  (1.626 m)   Wt 124 lb 6.4 oz (56.4 kg)   LMP 03/02/2016   SpO2 98%   BMI 21.35 kg/m       Objective:   Physical Exam  General Appearance- Not in acute distress.normal affect.  Heent- negative. But severe poor teeth. Missing teeth and obvious cavities.  Chest and Lung Exam Auscultation: Breath sounds:-Normal. Clear even and unlabored. Adventitious sounds:- No Adventitious sounds.  Cardiovascular Auscultation:Rythm - Regular, rate and rythm. Heart Sounds -Normal heart sounds.  Abdomen Inspection:-Inspection Normal.  Palpation/Perucssion: Palpation and Percussion of the abdomen reveal- Non Tender, No Rebound tenderness, No rigidity(Guarding) and No Palpable abdominal masses.  Liver:-Normal.  Spleen:- Normal.   Back  On palpation of her mid  lumbar spine does not exhibit severe pain. Does not apper to be in severe pain on straight leg lift. Fluid movements on getting of exam table today. Not like in severe pain.  Lower ext neurologic  L5-S1 sensation intact bilaterally. Normal patellar reflexes bilaterally. No foot drop bilaterally.      Assessment & Plan:  For your chronic low back pain will give you toradol 60 mg IM.   I will go ahead and rx your gabapentin.  Also will rx diclofenac. You can start that tomorrow.  Regarding your prior med use norco and  klonapin we need to get urine drug screen today. We also need your records from your previous office. We need to these in order to make informed decision if we can fill meds or if we ned to refer to pain management and/or psychiatrist.  Regarding conrolled meds need to follow [place] rules/Laws.  You report wheezing and are smoker(some productive cough recently). You may have bronchitis so will refill you albuterol and symbicort inhaler. Also will rx azithromycin.  Follow up in 7-10 days or as needed  Note will need to discuss  this pt with Dr. Laury AxonLowne since drug seeking behavior on her problem list.  [facility] site showed last rx of norco and klonopin on 01-18-2016. Looked like month supply.    Dorla Guizar, Ramon DredgeEdward, PA-C

## 2016-03-06 NOTE — Patient Instructions (Addendum)
For your chronic low back pain will give you toradol 60 mg IM.   I will go ahead and rx your gabapentin.  Also will rx diclofenac. You can start that tomorrow.  Regarding your prior med use norco and  klonapin we need to get urine drug screen today. We also need your records from your previous office. We need to these in order to make informed decision if we can fill meds or if we ned to refer to pain management and/or psychiatrist.  Regarding conrolled meds need to follow  rules/Laws.  You report wheezing and are smoker(some productive cough recently). You may have bronchitis so will refill you albuterol and symbicort inhaler. Also will rx azithromycin.  Follow up in 7-10 days or as needed

## 2016-03-06 NOTE — Progress Notes (Signed)
Pre visit review using our clinic review tool, if applicable. No additional management support is needed unless otherwise documented below in the visit note. 

## 2016-03-13 ENCOUNTER — Telehealth: Payer: Self-pay | Admitting: Medical

## 2016-03-13 ENCOUNTER — Ambulatory Visit (INDEPENDENT_AMBULATORY_CARE_PROVIDER_SITE_OTHER): Payer: BLUE CROSS/BLUE SHIELD | Admitting: Medical

## 2016-03-13 ENCOUNTER — Encounter: Payer: Self-pay | Admitting: Medical

## 2016-03-13 VITALS — BP 120/80 | HR 94 | Temp 98.4°F | Ht 64.0 in | Wt 125.4 lb

## 2016-03-13 DIAGNOSIS — G8929 Other chronic pain: Secondary | ICD-10-CM | POA: Diagnosis not present

## 2016-03-13 DIAGNOSIS — F4323 Adjustment disorder with mixed anxiety and depressed mood: Secondary | ICD-10-CM | POA: Diagnosis not present

## 2016-03-13 DIAGNOSIS — M5441 Lumbago with sciatica, right side: Secondary | ICD-10-CM | POA: Diagnosis not present

## 2016-03-13 MED ORDER — HYDROCODONE-ACETAMINOPHEN 5-325 MG PO TABS
ORAL_TABLET | ORAL | 0 refills | Status: DC
Start: 2016-03-13 — End: 2016-07-31

## 2016-03-13 MED ORDER — DICLOFENAC SODIUM 75 MG PO TBEC: 75.0000 mg | DELAYED_RELEASE_TABLET | Freq: Two times a day (BID) | ORAL | 0 refills | Status: DC

## 2016-03-13 MED ORDER — HYDROCODONE-ACETAMINOPHEN 5-325 MG PO TABS: ORAL_TABLET | ORAL | 0 refills | Status: DC

## 2016-03-13 MED ORDER — CLONAZEPAM 0.5 MG PO TABS: 0.5000 mg | ORAL_TABLET | Freq: Two times a day (BID) | ORAL | 0 refills | Status: DC | PRN

## 2016-03-13 NOTE — Progress Notes (Signed)
Pre visit review using our clinic review tool, if applicable. No additional management support is needed unless otherwise documented below in the visit note. 

## 2016-03-13 NOTE — Telephone Encounter (Signed)
Pt returned call. She says yes Kyung RuddKennedy is her maiden name.

## 2016-03-13 NOTE — Progress Notes (Signed)
Subjective:    Patient ID: Cristal FordChristine R Hicks, female    DOB: 07/25/1980, 35 y.o.   MRN: [id number]  HPI  Pt in states she is in a lot of pain. She states she is a lot of pain. Pt had back pain for 5 years. Pt former practice is closing. Pt states told former work up shows she had 2 bulging disc.   Pt states out of all her out of her clonopin, and  norco . In past tramadol did not help. Pt been out of both clonopin and norco for 10 days. (in chart problem list states drug seeking behavior??)  Pt location of back pain is constant lower back pain. Sometimes has pain shoots to her legs. No leg weakness, no saddles anesthesia, no incontinence or foot drop. No incontinence. Pt states gabapentin did help with her back pain. Pt also has hx of using muscle relaxants.  LMP- Pt last menstrual cycle was about 5 days. Normal came when she expected it to.  Pt has some asthma. Some wheezing recently. Some mild productive cough. Pt does have some albuterol. She has some symbicort in the past but has run out. Pt does smoke a pack a day.  Pt takes the clonopin for anxiety, depression and states bipolar history. Pt used to see psychiatrist. Has been a long time. No regular exercise, Admits not eating healthy,   Pt was working at [employer] 4 months ago. May restart working soon, Pt had 5 pregnancies at 4 children.(last pregnancy had twins).  Above from patient last visit. Pt uds came back negative. I still don't have records for patient. She was seen by two offices one on [address] and also at [facility].  Pt states moderate to severe pain for 2 days. Some pain shooting to her rt buttox area. No leg weakness. No saddle anesthesia.   Pt states history of bulging disk. Pt states surgery was considered.  LMP- 2 weeks ago.    Review of Systems  Constitutional: Negative for chills, fatigue and fever.  Respiratory: Negative for cough, chest tightness, shortness of breath and wheezing.     Cardiovascular: Negative for chest pain and palpitations.  Gastrointestinal: Negative for abdominal pain.  Musculoskeletal: Positive for back pain.  Neurological: Negative for dizziness, speech difficulty, weakness, light-headedness and headaches.  Hematological: Negative for adenopathy. Does not bruise/bleed easily.  Psychiatric/Behavioral: Negative for agitation, confusion, self-injury, sleep disturbance and suicidal ideas. The patient is nervous/anxious.    Past Medical History:  Diagnosis Date  . Anxiety   . Asthma   . Chronic back pain   . Depression   . Drug-seeking behavior   . Mental disorder      Social History   Social History  . Marital status: Married    Spouse name: N/A  . Number of children: N/A  . Years of education: N/A   Occupational History  . Not on file.   Social History Main Topics  . Smoking status: Current Every Day Smoker    Packs/day: 1.00  . Smokeless tobacco: Never Used  . Alcohol use No  . Drug use: No  . Sexual activity: Yes    Birth control/ protection: None   Other Topics Concern  . Not on file   Social History Narrative  . No narrative on file    Past Surgical History:  Procedure Laterality Date  . CESAREAN SECTION    . TUBAL LIGATION      No family history on file.  No Active Allergies  Current Outpatient Prescriptions on File Prior to Visit  Medication Sig Dispense Refill  . albuterol (PROVENTIL HFA;VENTOLIN HFA) 108 (90 Base) MCG/ACT inhaler Inhale 2 puffs into the lungs every 6 (six) hours as needed for wheezing or shortness of breath. 1 Inhaler 2  . budesonide-formoterol (SYMBICORT) 80-4.5 MCG/ACT inhaler Inhale 2 puffs into the lungs 2 (two) times daily. 1 Inhaler 3  . diclofenac (VOLTAREN) 75 MG EC tablet Take 1 tablet (75 mg total) by mouth 2 (two) times daily. 20 tablet 0  . gabapentin (NEURONTIN) 100 MG capsule Take 1 capsule (100 mg total) by mouth 3 (three) times daily. 90 capsule 1  . HYDROcodone-acetaminophen  (NORCO) 10-325 MG per tablet Take 1 tablet by mouth every 6 (six) hours as needed. 6 tablet 0   No current facility-administered medications on file prior to visit.     BP 120/80 (BP Location: Left Arm, Patient Position: Sitting)   Pulse 94   Temp 98.4 F (36.9 C) (Oral)   Ht 5\' 4"  (1.626 m)   Wt 125 lb 6.4 oz (56.9 kg)   LMP 03/02/2016   SpO2 97%   BMI 21.52 kg/m       Objective:   Physical Exam  General Appearance- Not in acute distress but looks uncomfortable today.  Heent- negative. But severe poor teeth. Missing teeth and obvious cavities.  Chest and Lung Exam Auscultation: Breath sounds:-Normal. Clear even and unlabored. Adventitious sounds:- No Adventitious sounds.  Cardiovascular Auscultation:Rythm - Regular, rate and rythm. Heart Sounds -Normal heart sounds.  Abdomen Inspection:-Inspection Normal.  Palpation/Perucssion: Palpation and Percussion of the abdomen reveal- Non Tender, No Rebound tenderness, No rigidity(Guarding) and No Palpable abdominal masses.  Liver:-Normal.  Spleen:- Normal.   Back  On palpation of her mid  lumbar spine does have more pain today than last visit. Pt  appears to have moderate to severe pain on straight leg lift. Pt appears to be more pain today with movement.  Lower ext neurologic  L5-S1 sensation intact bilaterally. Normal patellar reflexes bilaterally. No foot drop bilaterally.       Assessment & Plan:  For your back pain we need records from both your previous clinics.  I am only writing you 5 days of narcotics(max can write until make further decision after review of records.)  Will rx diclofenac to compliment/help for pain and inflammation.  Continue neurontin.  For anxiety will rx klonopin.  We need to get records and I also need to talk with my supervising MD on if we will be your prescriber of pain meds or if we will refer to pain management.  Follow up in 2 weeks or as needed  Pt uds came back  negative. But still being cautious since no records and comment in chart about drug seeking behavior for which I could not find details?  Jahzier Villalon, Ramon DredgeEdward, PA-C

## 2016-03-13 NOTE — Telephone Encounter (Signed)
Relation to ZO:XWRUpt:self Call back number:[phone]   Reason for call:  \Patient filled out medical release form today [facility]. Wynelle LinkSun. Previous doctor office states they have patient under her maiden name "Kyung RuddKennedy" lvm requesting patient to confirm before faxing.

## 2016-03-13 NOTE — Patient Instructions (Addendum)
For your back pain we need records from both your previous clinics.  I am only writing you 5 days of narcotics(max can write until make further decision after review of records.)  Will rx diclofenac to compliment/help for pain and inflammation.  Continue neurontin.  For anxiety will rx klonopin.(limited supply presently)  We need to get records and I also need to talk with my supervising MD on if we will be your prescriber of pain meds or if we will refer to pain management.  Follow up in 2 weeks or as needed

## 2016-03-13 NOTE — Telephone Encounter (Signed)
Dr. Laury AxonLowne,   I wanted your advise on this pt. I have seen her twice. If you could review recent 2 notes. Summary new pt with chronic back pain for years. Has seen 2 practices in past for chronic back pain. Problem list states drug seeking behavior(but no other explanation). Took me little time to find but 01-19-2015  ED note which explains in more detail regarding suspicion of drug seeking behavior.   Pt drug screen came back negative. I have not gotten her to sign contract as I am unsure whether our clinic should contract with her without getting her records and reviewing first. Maybe she was dismissed before. Situation is unclear.   I only gave her 5 days of norco low dose and limited klonopin. Currently following [place] law and treating her more like acute pain patient.  Wanted you to be aware as next week I am not in the office and she may run out of meds and ask for refills.  Currently waiting on records.  Thanks, Ramon DredgeEdward

## 2016-03-13 NOTE — Telephone Encounter (Signed)
Medical release faxed confirmation received.

## 2016-03-14 ENCOUNTER — Telehealth: Payer: Self-pay | Admitting: Medical

## 2016-03-14 DIAGNOSIS — M5441 Lumbago with sciatica, right side: Principal | ICD-10-CM

## 2016-03-14 DIAGNOSIS — G8929 Other chronic pain: Secondary | ICD-10-CM

## 2016-03-14 NOTE — Telephone Encounter (Signed)
Called and left message for call back.

## 2016-03-14 NOTE — Telephone Encounter (Signed)
   I saw your note late last night past 10 pm. Would you call pt first thing in morning and try to assess how she is feeling. Regarding thoughts of harming self or others. I saw your note and need to clarify if she has any intentions. I am considering social service stat emergency evaluation. Calling 911 tomorrow if she seems unstable and if uncooperative in regards to possibly coming into the office. I would be willing to see her in office. And then walk down with her to ED if she does appear to be unstable.(don't divulge that I might send to ED. But I very well may do so)  Would you call pt and get general sense of how she feels. Her attitude and if she is repeating what she stated yesterday.  Let her know that I did place the pain management referral in. I also put referral in to psychiatry.   After you get her on the phone I would like to talk with her as well.  Let me know before you make the call. I want to discuss the situation with Dr. Laury AxonLowne.  This needs to be done tomorrow. I won't be in next week. Would like to know how she is doing before the weekend. And so can notify fellow providers.

## 2016-03-14 NOTE — Telephone Encounter (Signed)
Will you let pt know that I reviewed her pain history with my supervising MD and she advised we won't be writing her any more pain meds. So would you call patient and explain this to her.

## 2016-03-14 NOTE — Telephone Encounter (Signed)
I would not write any narcotics for her

## 2016-03-14 NOTE — Telephone Encounter (Signed)
Alexis DikeJennifer please see the psychiatry referral and touch base with me about referral on March 15, 2016.

## 2016-03-14 NOTE — Telephone Encounter (Signed)
Pt returned call.  Provider's note reviewed with patient.  Pt requested that she be referred to pain management.  She said she has been on pain medication for 5 years and doesn't understanding why it's a problem.  She said "you guys wonder why people are going around shooting people and killing people, well that's why."  She said she's bipolar, her stress levels are high right now, and she's ready to hurt somebody.  She would not specify who.  She said if she could just get a referral to pain management as soon as possible.    Please advise.

## 2016-03-15 NOTE — Telephone Encounter (Signed)
Referral sent in Epic to [facility], they will contact pt directly to schedule

## 2016-03-15 NOTE — Telephone Encounter (Signed)
Called to follow up with patient this morning. She sound calm over the phone.  She said she feels fine this morning.  Denied suicidal/homicidal ideations.  She said yesterday she was just upset and with her being bipolar, one moment she's fine and the next moment she's not.  She wanted to know why Ramon Dredgedward is refusing to refill her pain medication and said she has done nothing wrong.  Offered her an appt to come in and discuss with Ramon DredgeEdward.  She declined.  Made Edward aware and he came to discuss with patient.  He agreed that patient sound calm over the phone.  He explained to patient that pain referral was ordered and that he would try to get her in to see pain management as soon as possible.  He also explained that given new laws governing the prescribing of controlled substances, he was advised by his supervising physician not to prescribe anymore pain medication for patient.

## 2016-03-16 ENCOUNTER — Telehealth: Payer: Self-pay | Admitting: Medical

## 2016-03-16 NOTE — Telephone Encounter (Signed)
I did talk with pt on March 15, 2016. She denied any suicidal or homicidal ideations. She sounded calm during conversation. I explained that I reviewed whether to prescribe pain med for her with my supervising physician and decision made to not prescribe any further but to refer to pain management, Pt expressed understanding.   I advised pt that if she has homicidal or suicidal thoughts then go to ED. Pt expressed understanding.  Referral to both pain management and psychiatry made.   I did inform Supervising physician of both Ashlee RN conversation with patient and my conversation with patient,

## 2016-03-27 ENCOUNTER — Encounter: Payer: Self-pay | Admitting: Medical

## 2016-03-27 ENCOUNTER — Ambulatory Visit (INDEPENDENT_AMBULATORY_CARE_PROVIDER_SITE_OTHER): Payer: BLUE CROSS/BLUE SHIELD | Admitting: Medical

## 2016-03-27 VITALS — BP 118/78 | HR 100 | Temp 98.2°F | Ht 64.0 in | Wt 121.8 lb

## 2016-03-27 DIAGNOSIS — N921 Excessive and frequent menstruation with irregular cycle: Secondary | ICD-10-CM

## 2016-03-27 DIAGNOSIS — G8929 Other chronic pain: Secondary | ICD-10-CM | POA: Diagnosis not present

## 2016-03-27 DIAGNOSIS — F411 Generalized anxiety disorder: Secondary | ICD-10-CM

## 2016-03-27 DIAGNOSIS — Z23 Encounter for immunization: Secondary | ICD-10-CM

## 2016-03-27 DIAGNOSIS — M545 Low back pain: Secondary | ICD-10-CM | POA: Diagnosis not present

## 2016-03-27 LAB — CBC WITH DIFFERENTIAL/PLATELET
Basophils Absolute: 0.1 10*3/uL (ref 0.0–0.1)
Basophils Relative: 1.2 % (ref 0.0–3.0)
Eosinophils Absolute: 0.6 10*3/uL (ref 0.0–0.7)
Eosinophils Relative: 5.7 % — ABNORMAL HIGH (ref 0.0–5.0)
HCT: 44.9 % (ref 36.0–46.0)
Hemoglobin: 15.4 g/dL — ABNORMAL HIGH (ref 12.0–15.0)
Lymphocytes Relative: 26.5 % (ref 12.0–46.0)
Lymphs Abs: 2.6 10*3/uL (ref 0.7–4.0)
MCHC: 34.2 g/dL (ref 30.0–36.0)
MCV: 95.6 fl (ref 78.0–100.0)
Monocytes Absolute: 0.6 10*3/uL (ref 0.1–1.0)
Monocytes Relative: 6 % (ref 3.0–12.0)
Neutro Abs: 6 10*3/uL (ref 1.4–7.7)
Neutrophils Relative %: 60.6 % (ref 43.0–77.0)
Platelets: 198 10*3/uL (ref 150.0–400.0)
RBC: 4.7 Mil/uL (ref 3.87–5.11)
RDW: 13.9 % (ref 11.5–15.5)
WBC: 9.8 10*3/uL (ref 4.0–10.5)

## 2016-03-27 LAB — POCT URINE PREGNANCY: Preg Test, Ur: NEGATIVE

## 2016-03-27 MED ORDER — CYCLOBENZAPRINE HCL 5 MG PO TABS: ORAL_TABLET | ORAL | 0 refills | Status: DC

## 2016-03-27 MED ORDER — CLONAZEPAM 0.5 MG PO TABS: 0.5000 mg | ORAL_TABLET | Freq: Two times a day (BID) | ORAL | 0 refills | Status: DC | PRN

## 2016-03-27 MED ORDER — KETOROLAC TROMETHAMINE 60 MG/2ML IM SOLN
60.0000 mg | Freq: Once | INTRAMUSCULAR | Status: AC
  Administered 2016-03-27: 60 mg via INTRAMUSCULAR

## 2016-03-27 NOTE — Progress Notes (Signed)
Pre visit review using our clinic review tool, if applicable. No additional management support is needed unless otherwise documented below in the visit note. 

## 2016-03-27 NOTE — Progress Notes (Signed)
Subjective:    Patient ID: Alexis Kane, female    DOB: 06/17/1980, 35 y.o.   MRN: [id number]  HPI   Pt in for follow up.   Pt has some back pain that is shooting from her lower back up her back. But pain is mostly in lspine. No new injury or fall.   Pt is on neurontin and diclofenac. No muscle relaxant use.   Pt pain level in past moderate to severe per her report. See prior note. She  Is still relatively new pt to our practice. Dr. Laury AxonLowne advised not writing narcotics(I had reviewed pt history and ED visits notes with Dr. Laury AxonLowne)   Pt reports some stress.  Pt states no homicdal or suicidal ideations. She was quite upset when we advised her about decision not to give narcotics. The next day I had talked with pt and she seemed calm and expressed understanding/accepted our decision.  In past I have given her toradol IM injection which helped with pain per pt report. Pt states she feels some muscle spasm in her back.  No red flag symptoms reported.   LMP- one week ago.  Pt states every 2 weeks(for 2 months) has been having some bleeding for 2-3 days. But currently no bleeding or menses. No hx of any fibroids. Pregnancy test negative today.  Pt got a flu vaccine today.   Pt is out of anxiety medication completely. She would like refills.           Review of Systems  Constitutional: Negative for chills, fatigue and fever.  Respiratory: Negative for choking, shortness of breath and wheezing.   Cardiovascular: Negative for chest pain and palpitations.  Gastrointestinal: Negative for abdominal pain.  Genitourinary: Positive for vaginal bleeding. Negative for difficulty urinating, flank pain and frequency.  Musculoskeletal: Positive for back pain. Negative for arthralgias, gait problem, myalgias and neck stiffness.  Skin: Negative for rash.  Neurological: Negative for dizziness, tremors, seizures, syncope, speech difficulty, weakness and light-headedness.       No saddle  anesthesia.no leg weakness. No foot drop.   Hematological: Negative for adenopathy. Does not bruise/bleed easily.  Psychiatric/Behavioral: Negative for agitation, behavioral problems, confusion, dysphoric mood and suicidal ideas. The patient is nervous/anxious.    Past Medical History:  Diagnosis Date  . Anxiety   . Asthma   . Chronic back pain   . Depression   . Drug-seeking behavior   . Mental disorder      Social History   Social History  . Marital status: Married    Spouse name: N/A  . Number of children: N/A  . Years of education: N/A   Occupational History  . Not on file.   Social History Main Topics  . Smoking status: Current Every Day Smoker    Packs/day: 1.00  . Smokeless tobacco: Never Used  . Alcohol use No  . Drug use: No  . Sexual activity: Yes    Birth control/ protection: None   Other Topics Concern  . Not on file   Social History Narrative  . No narrative on file    Past Surgical History:  Procedure Laterality Date  . CESAREAN SECTION    . TUBAL LIGATION      No family history on file.  No Active Allergies  Current Outpatient Prescriptions on File Prior to Visit  Medication Sig Dispense Refill  . albuterol (PROVENTIL HFA;VENTOLIN HFA) 108 (90 Base) MCG/ACT inhaler Inhale 2 puffs into the lungs every 6 (six) hours  as needed for wheezing or shortness of breath. 1 Inhaler 2  . budesonide-formoterol (SYMBICORT) 80-4.5 MCG/ACT inhaler Inhale 2 puffs into the lungs 2 (two) times daily. 1 Inhaler 3  . diclofenac (VOLTAREN) 75 MG EC tablet Take 1 tablet (75 mg total) by mouth 2 (two) times daily. 20 tablet 0  . gabapentin (NEURONTIN) 100 MG capsule Take 1 capsule (100 mg total) by mouth 3 (three) times daily. 90 capsule 1  . HYDROcodone-acetaminophen (NORCO) 5-325 MG tablet 1 tab po q  8 hours as needed for pain 15 tablet 0   No current facility-administered medications on file prior to visit.     BP 118/78 (BP Location: Left Arm, Patient  Position: Sitting)   Pulse 100   Temp 98.2 F (36.8 C) (Oral)   Ht 5\' 4"  (1.626 m)   Wt 121 lb 12.8 oz (55.2 kg)   LMP 03/02/2016   SpO2 96%   BMI 20.91 kg/m       Objective:   Physical Exam  General Appearance- Not in acute distress.    Chest and Lung Exam Auscultation: Breath sounds:-Normal. Clear even and unlabored. Adventitious sounds:- No Adventitious sounds.  Cardiovascular Auscultation:Rythm - Regular, rate and rythm. Heart Sounds -Normal heart sounds.  Abdomen Inspection:-Inspection Normal.  Palpation/Perucssion: Palpation and Percussion of the abdomen reveal- Non Tender, No Rebound tenderness, No rigidity(Guarding) and No Palpable abdominal masses.  Liver:-Normal.  Spleen:- Normal.   Back Mid lumbar spine tenderness to palpation and rt parlumbar tendernss Pain on straight leg lift. Pain on lateral movements and flexion/extension of the spine.  Lower ext neurologic  L5-S1 sensation intact bilaterally. Normal patellar reflexes bilaterally. No foot drop bilaterally.      Assessment & Plan:  For you back pain we gave toradol 60 mg im.  For muscle relaxation flexeril low dose to use at night.  Hold diclofenac for now but restart tomorrow afternoon.   Continue neurontin.   Will continue to try to refer you to pain management.  For anxiety I am prescribing limited clonazepam.I will ask my supervising physician if it is ok to prescribe you the clonazepam in the future.  For you intermittent and frequent menses/vaginal bleeding we got preg test. It was negative. Will place order to get pelvic ultrasound to evaluate if fibroids.(asked pt to go down to radiology now to schedule the studies)  Follow up in 2 weeks or as needed  If at any point thoughts of hurting self or others then ED evaluation. (Reminded pt and discussed since she was upset other day about not getting narcotic)

## 2016-03-27 NOTE — Patient Instructions (Addendum)
For you back pain we gave toradol 60 mg im.  For muscle relaxation flexeril low dose to use at night.  Hold diclofenac for now but restart tomorrow afternoon.   Continue neurontin.   Will continue to try to refer you to pain management.  For anxiety I am prescribing limited clonazepam.I will ask my supervising physician if it is ok to prescribe you the clonazepam in the future.  For you intermittent and frequent menses/vaginal bleeding we got preg test. It was negative. Will place order to get pelvic ultrasound to evaluate if fibroids.(asked pt to go down to radiology now to schedule the studies)  Follow up in 2 weeks or as needed

## 2016-03-30 ENCOUNTER — Ambulatory Visit (HOSPITAL_BASED_OUTPATIENT_CLINIC_OR_DEPARTMENT_OTHER): Payer: BLUE CROSS/BLUE SHIELD

## 2016-03-30 ENCOUNTER — Ambulatory Visit (HOSPITAL_BASED_OUTPATIENT_CLINIC_OR_DEPARTMENT_OTHER): Admission: RE | Admit: 2016-03-30 | Payer: BLUE CROSS/BLUE SHIELD | Source: Ambulatory Visit

## 2016-04-08 ENCOUNTER — Telehealth: Payer: Self-pay

## 2016-04-08 NOTE — Telephone Encounter (Signed)
Opened in error

## 2016-04-09 ENCOUNTER — Ambulatory Visit (HOSPITAL_BASED_OUTPATIENT_CLINIC_OR_DEPARTMENT_OTHER)
Admission: RE | Admit: 2016-04-09 | Discharge: 2016-04-09 | Disposition: A | Discharge status: Home / Self Care | Payer: BLUE CROSS/BLUE SHIELD | Source: Ambulatory Visit | Attending: Medical | Admitting: Medical

## 2016-04-09 ENCOUNTER — Telehealth: Payer: Self-pay | Admitting: Medical

## 2016-04-09 ENCOUNTER — Ambulatory Visit (HOSPITAL_BASED_OUTPATIENT_CLINIC_OR_DEPARTMENT_OTHER): Payer: BLUE CROSS/BLUE SHIELD

## 2016-04-09 DIAGNOSIS — N921 Excessive and frequent menstruation with irregular cycle: Secondary | ICD-10-CM

## 2016-04-09 DIAGNOSIS — Z23 Encounter for immunization: Secondary | ICD-10-CM

## 2016-04-09 DIAGNOSIS — M5441 Lumbago with sciatica, right side: Secondary | ICD-10-CM | POA: Insufficient documentation

## 2016-04-09 DIAGNOSIS — G8929 Other chronic pain: Secondary | ICD-10-CM

## 2016-04-09 NOTE — Telephone Encounter (Signed)
Referral to gyn placed. 

## 2016-04-10 ENCOUNTER — Ambulatory Visit: Payer: BLUE CROSS/BLUE SHIELD | Admitting: Medical

## 2016-04-15 ENCOUNTER — Ambulatory Visit: Payer: BLUE CROSS/BLUE SHIELD | Admitting: Medical

## 2016-04-15 ENCOUNTER — Ambulatory Visit (INDEPENDENT_AMBULATORY_CARE_PROVIDER_SITE_OTHER): Payer: BLUE CROSS/BLUE SHIELD | Admitting: Medical

## 2016-04-15 ENCOUNTER — Encounter: Payer: Self-pay | Admitting: Medical

## 2016-04-15 VITALS — BP 98/64 | HR 100 | Temp 98.4°F | Ht 64.0 in | Wt 126.6 lb

## 2016-04-15 DIAGNOSIS — G8929 Other chronic pain: Secondary | ICD-10-CM

## 2016-04-15 DIAGNOSIS — N921 Excessive and frequent menstruation with irregular cycle: Secondary | ICD-10-CM

## 2016-04-15 DIAGNOSIS — M5441 Lumbago with sciatica, right side: Secondary | ICD-10-CM

## 2016-04-15 DIAGNOSIS — F411 Generalized anxiety disorder: Secondary | ICD-10-CM | POA: Diagnosis not present

## 2016-04-15 MED ORDER — CLONAZEPAM 0.5 MG PO TABS: 0.5000 mg | ORAL_TABLET | Freq: Two times a day (BID) | ORAL | 0 refills | Status: DC | PRN

## 2016-04-15 MED ORDER — SERTRALINE HCL 50 MG PO TABS: 50.0000 mg | ORAL_TABLET | Freq: Every day | ORAL | 3 refills | Status: DC

## 2016-04-15 MED ORDER — GABAPENTIN 100 MG PO CAPS: 100.0000 mg | ORAL_CAPSULE | Freq: Three times a day (TID) | ORAL | 0 refills | Status: DC

## 2016-04-15 NOTE — Progress Notes (Signed)
   Subjective:    Patient ID: Alexis FordChristine R Kane, female    DOB: 05/08/1981, 35 y.o.   MRN: [id number]  HPI      Review of Systems     Objective:   Physical Exam        Assessment & Plan:

## 2016-04-15 NOTE — Progress Notes (Signed)
Pre visit review using our clinic review tool, if applicable. No additional management support is needed unless otherwise documented below in the visit note. 

## 2016-04-15 NOTE — Progress Notes (Signed)
Subjective:    Patient ID: Alexis Kane, female    DOB: 09/13/1980, 35 y.o.   MRN: [id number]  HPI  Pt in for follow up and she stats filled and used clonazepam. Pt filled and used it twice a day. She states daily anxiety low level but at times severe. Pt has history of being on sertraline in the past. This was 3 years ago and she thinks she was on 50 mg dose. This was when she was with psychiatrist. Last provider had her just on the klonopin. She states has been on klonopin for last 5 years. Pt recently ran out of the klonopin. Controlled med site checked today and no other controlled med rx'd. Did not see the rx I gave her?  Pt has some back pain history. Chronic type pain. My supervising MD did not authorize us writing narcotic for her pain. Pt has pain management referral pending. Pt needs refill of neurontin. I had given diclofenac last visit. It is helping some. Pt states toradol did help on last visit.  Pt still has vaginal bleeding moderate heavy every 2 weeks.  I did US of vagina. Reviewed report and placed order to see gynecologist.(last had bleeidng on 04-09-2016. Her preg test was negative 03-27-2017).       Review of Systems  Constitutional: Negative for chills, fatigue and fever.  Respiratory: Negative for cough, shortness of breath and wheezing.   Cardiovascular: Negative for chest pain and palpitations.  Gastrointestinal: Negative for abdominal pain.  Genitourinary: Positive for menstrual problem.  Musculoskeletal: Positive for back pain.  Neurological: Negative for dizziness, seizures, syncope and weakness.  Hematological: Does not bruise/bleed easily.  Psychiatric/Behavioral: Negative for behavioral problems, confusion, dysphoric mood, sleep disturbance and suicidal ideas. The patient is nervous/anxious.    Past Medical History:  Diagnosis Date  . Anxiety   . Asthma   . Chronic back pain   . Depression   . Drug-seeking behavior   . Mental disorder        Social History   Social History  . Marital status: Married    Spouse name: N/A  . Number of children: N/A  . Years of education: N/A   Occupational History  . Not on file.   Social History Main Topics  . Smoking status: Current Every Day Smoker    Packs/day: 1.00  . Smokeless tobacco: Never Used  . Alcohol use No  . Drug use: No  . Sexual activity: Yes    Birth control/ protection: None   Other Topics Concern  . Not on file   Social History Narrative  . No narrative on file    Past Surgical History:  Procedure Laterality Date  . CESAREAN SECTION    . TUBAL LIGATION      No family history on file.  No Known Allergies  Current Outpatient Prescriptions on File Prior to Visit  Medication Sig Dispense Refill  . albuterol (PROVENTIL HFA;VENTOLIN HFA) 108 (90 Base) MCG/ACT inhaler Inhale 2 puffs into the lungs every 6 (six) hours as needed for wheezing or shortness of breath. 1 Inhaler 2  . budesonide-formoterol (SYMBICORT) 80-4.5 MCG/ACT inhaler Inhale 2 puffs into the lungs 2 (two) times daily. 1 Inhaler 3  . cyclobenzaprine (FLEXERIL) 5 MG tablet 1 tab po q hs muscle spasms 10 tablet 0  . diclofenac (VOLTAREN) 75 MG EC tablet Take 1 tablet (75 mg total) by mouth 2 (two) times daily. 20 tablet 0  . HYDROcodone-acetaminophen (NORCO) 5-325 MG tablet 1  tab po q  8 hours as needed for pain 15 tablet 0   No current facility-administered medications on file prior to visit.     BP 98/64 (BP Location: Left Arm, Patient Position: Sitting, Cuff Size: Normal)   Pulse 100   Temp 98.4 F (36.9 C) (Oral)   Ht 5\' 4"  (1.626 m)   Wt 126 lb 9.6 oz (57.4 kg)   LMP 04/06/2016   SpO2 98%   BMI 21.73 kg/m       Objective:   Physical Exam   General Appearance- Not in acute distress.normal affect today.    Chest and Lung Exam Auscultation: Breath sounds:-Normal. Clear even and unlabored. Adventitious sounds:- No Adventitious sounds.  Cardiovascular Auscultation:Rythm -  Regular, rate and rythm. Heart Sounds -Normal heart sounds.  Abdomen Inspection:-Inspection Normal.  Palpation/Perucssion: Palpation and Percussion of the abdomen reveal- Non Tender, No Rebound tenderness, No rigidity(Guarding) and No Palpable abdominal masses.  Liver:-Normal.  Spleen:- Normal.   Back Mild  Mid lumbar spine tenderness to palpation. Pain on lateral movements and flexion/extension of the spine.  Lower ext neurologic  L5-S1 sensation intact bilaterally. Normal patellar reflexes bilaterally. No foot drop bilaterally.      Assessment & Plan:  For you anxiety I will prescribe sertraline 50 mg tabs to use 1 tab po a day. I will prescribe klonopin again.Discussed rx with supervising MD and she ok'd rx of monthly klonopin. Note pt did sign the controlled med contract today. But I think she may have taken that with her or I inadvertantly placed it in my shred box. So will get her resign on follow up.   For your back pain history continue diclofenac and refilling your neurontin. Will again ask our staff to try to refer you.   For your irregular and heavy vaginal bleeding will refer you to gyn Alexis Kane and Alexis Kane are our referral staff. Please call here if gyn does not call you within one week.  Follow up in one month or as needed

## 2016-04-15 NOTE — Patient Instructions (Addendum)
For you anxiety I will prescribe sertraline 50 mg tabs to use 1 tab po a day. I will prescribe klonopin again.Discussed rx with supervising MD and she ok'd rx of monthly klonopin. Note pt did sign the controlled med contract today. But I think she may have taken that with her or I inadvertently placed it in my shred box. So will get her resign on follow up.   For your back pain history continue diclofenac and refilling your neurontin. Will again ask our staff to try to refer you.   For your irregular and heavy vaginal bleeding will refer you to gyn [name] and [name] are our referral staff. Please call here if gyn does not call you within one week.  Follow up in one month or as needed

## 2016-04-16 ENCOUNTER — Ambulatory Visit: Payer: BLUE CROSS/BLUE SHIELD | Admitting: Medical

## 2016-05-01 ENCOUNTER — Other Ambulatory Visit: Payer: Self-pay | Admitting: Medical

## 2016-05-08 ENCOUNTER — Ambulatory Visit: Payer: BLUE CROSS/BLUE SHIELD | Admitting: Gynecology

## 2016-05-15 ENCOUNTER — Ambulatory Visit (HOSPITAL_COMMUNITY): Payer: BLUE CROSS/BLUE SHIELD

## 2016-05-15 ENCOUNTER — Encounter (HOSPITAL_COMMUNITY): Payer: Self-pay

## 2016-05-16 ENCOUNTER — Ambulatory Visit (INDEPENDENT_AMBULATORY_CARE_PROVIDER_SITE_OTHER): Payer: BLUE CROSS/BLUE SHIELD | Admitting: Medical

## 2016-05-16 VITALS — BP 125/89 | HR 102 | Temp 98.0°F | Ht 64.0 in | Wt 125.8 lb

## 2016-05-16 DIAGNOSIS — F411 Generalized anxiety disorder: Secondary | ICD-10-CM

## 2016-05-16 DIAGNOSIS — M5441 Lumbago with sciatica, right side: Secondary | ICD-10-CM

## 2016-05-16 DIAGNOSIS — G8929 Other chronic pain: Secondary | ICD-10-CM | POA: Diagnosis not present

## 2016-05-16 MED ORDER — CLONAZEPAM 0.5 MG PO TABS: 0.5000 mg | ORAL_TABLET | Freq: Two times a day (BID) | ORAL | 2 refills | Status: DC | PRN

## 2016-05-16 MED ORDER — SERTRALINE HCL 50 MG PO TABS: 50.0000 mg | ORAL_TABLET | Freq: Every day | ORAL | 3 refills | Status: DC

## 2016-05-16 MED ORDER — KETOROLAC TROMETHAMINE 60 MG/2ML IM SOLN
60.0000 mg | Freq: Once | INTRAMUSCULAR | Status: AC
  Administered 2016-05-16: 60 mg via INTRAMUSCULAR

## 2016-05-16 MED ORDER — CYCLOBENZAPRINE HCL 5 MG PO TABS: ORAL_TABLET | ORAL | 0 refills | Status: DC

## 2016-05-16 NOTE — Progress Notes (Signed)
Pre visit review using our clinic tool,if applicable. No additional management support is needed unless otherwise documented below in the visit note.  

## 2016-05-16 NOTE — Progress Notes (Signed)
Subjective:    Patient ID: Alexis Kane, female    DOB: 1981-04-01, 36 y.o.   MRN: [id number]  HPI  Pt in for back pain today. Pt states he back pain flared up yesterday(now moderate to severe). No injury just states started. Pt came to our office in November. I put in pain management referral back then. Pt states no one every contacted her on the referral. I had written her for diclofenac and she is on gabapentin. Pt came to me initially from another practice. After consulting with supervising MD decision was made to refer to pain management and not be her prescriber of narcotic.  Pt not reporting incontinence, saddle anesthesia or leg weakness.  Pt xray of her lumbar spine showed normal alignment. No acute abnormality.  LMP- 1 week ago.   Pt states still some anxiety. She states recently anxiety is worse with her pain. But before recent back pain  she states her anxiety was controlled with klonopin and sertraline. Pt is out of both meds for anxiety.(just ran out)    Review of Systems  Constitutional: Negative for chills, fatigue and fever.  Respiratory: Negative for cough, chest tightness, shortness of breath and wheezing.   Cardiovascular: Negative for chest pain and palpitations.  Musculoskeletal: Positive for back pain. Negative for arthralgias, neck pain and neck stiffness.  Skin: Negative for pallor and rash.  Neurological: Negative for dizziness, syncope, speech difficulty, weakness, numbness and headaches.  Hematological: Negative for adenopathy. Does not bruise/bleed easily.  Psychiatric/Behavioral: Negative for behavioral problems, sleep disturbance and suicidal ideas. The patient is nervous/anxious.        Reports some anxiety recently with back pain.    Past Medical History:  Diagnosis Date  . Anxiety   . Asthma   . Chronic back pain   . Depression   . Drug-seeking behavior   . Mental disorder      Social History   Social History  . Marital status:  Married    Spouse name: N/A  . Number of children: N/A  . Years of education: N/A   Occupational History  . Not on file.   Social History Main Topics  . Smoking status: Current Every Day Smoker    Packs/day: 1.00  . Smokeless tobacco: Never Used  . Alcohol use No  . Drug use: No  . Sexual activity: Yes    Birth control/ protection: None   Other Topics Concern  . Not on file   Social History Narrative  . No narrative on file    Past Surgical History:  Procedure Laterality Date  . CESAREAN SECTION    . TUBAL LIGATION      No family history on file.  No Known Allergies  Current Outpatient Prescriptions on File Prior to Visit  Medication Sig Dispense Refill  . albuterol (PROVENTIL HFA;VENTOLIN HFA) 108 (90 Base) MCG/ACT inhaler Inhale 2 puffs into the lungs every 6 (six) hours as needed for wheezing or shortness of breath. 1 Inhaler 2  . budesonide-formoterol (SYMBICORT) 80-4.5 MCG/ACT inhaler Inhale 2 puffs into the lungs 2 (two) times daily. 1 Inhaler 3  . clonazePAM (KLONOPIN) 0.5 MG tablet Take 1 tablet (0.5 mg total) by mouth 2 (two) times daily as needed for anxiety. 60 tablet 0  . cyclobenzaprine (FLEXERIL) 5 MG tablet 1 tab po q hs muscle spasms 10 tablet 0  . diclofenac (VOLTAREN) 75 MG EC tablet Take 1 tablet (75 mg total) by mouth 2 (two) times daily. 20 tablet  0  . gabapentin (NEURONTIN) 100 MG capsule Take 1 capsule (100 mg total) by mouth 3 (three) times daily. 90 capsule 0  . gabapentin (NEURONTIN) 100 MG capsule Take 1 capsule (100 mg total) by mouth 3 (three) times daily. 90 capsule 1  . sertraline (ZOLOFT) 50 MG tablet Take 1 tablet (50 mg total) by mouth daily. 30 tablet 3  . HYDROcodone-acetaminophen (NORCO) 5-325 MG tablet 1 tab po q  8 hours as needed for pain (Patient not taking: Reported on 05/16/2016) 15 tablet 0   No current facility-administered medications on file prior to visit.     BP 125/89   Pulse (!) 102   Temp 98 F (36.7 C) (Oral)   Ht  5\' 4"  (1.626 m)   Wt 125 lb 12.8 oz (57.1 kg)   LMP 05/09/2016   BMI 21.59 kg/m      Objective:   Physical Exam  General Appearance- Not in acute distress. Seems anxious and moderate pain today.    Chest and Lung Exam Auscultation: Breath sounds:-Normal. Clear even and unlabored. Adventitious sounds:- No Adventitious sounds.  Cardiovascular Auscultation:Rythm - Regular, rate and rythm. Heart Sounds -Normal heart sounds.  Abdomen Inspection:-Inspection Normal.  Palpation/Perucssion: Palpation and Percussion of the abdomen reveal- Non Tender, No Rebound tenderness, No rigidity(Guarding) and No Palpable abdominal masses.  Liver:-Normal.  Spleen:- Normal.   Back  Moderate Mid lumbar spine tenderness to palpation. Pain on straight leg lift. Pain on lateral movements and flexion/extension of the spine.  Lower ext neurologic  L5-S1 sensation intact bilaterally. Normal patellar reflexes bilaterally. No foot drop bilaterally.      Assessment & Plan:  For your anxiety history will refill your klonopin and sertraline.  For you back pain we gave you toradol 60 mg im today. Hold diclofenac today and restart diclofenac tomorrow. Continue gabapentin.  Wil refill you flexeril but low dose and low number.  We called the pain managment clinic and investigate what happened to your referral. Apparently you were out of network.   Red flags reviewed with pt that would necesitate evaluation in ED/  Follow up in 3 months for anxiety. Or as needed.  Regarding your back pain you might consider returning to prior MD office who gave you narcotic in the past. I believe he has new office in [place] or [place].  Note after toradol pt pain level seemed to subside.  Jereline Ticer, Ramon DredgeEdward, PA-C

## 2016-05-16 NOTE — Patient Instructions (Addendum)
For your anxiety history will refill your klonopin and sertraline.  For you back pain we gave you toradol 60 mg im today. Hold diclofenac today and restart diclofenac tomorrow. Continue gabapentin.  Wil refill you flexeril but low dose and low number.  We called the pain management clinic and investigate what happened to your referral. Apparently you were out of network.(call her by Monday of next week and speak with [name] she is resubmitting your referal)  Red flags reviewed with pt that would necesitate evaluation in ED.  Follow up in 3 months for anxiety. Or as needed.  Regarding your back pain you might consider returning to prior MD office who gave you narcotic in the past. I believe he has new office in [place] or [place].

## 2016-05-24 ENCOUNTER — Ambulatory Visit: Payer: BLUE CROSS/BLUE SHIELD | Admitting: Gynecology

## 2016-06-04 ENCOUNTER — Ambulatory Visit: Payer: Self-pay | Admitting: Gynecology

## 2016-06-04 DIAGNOSIS — Z0289 Encounter for other administrative examinations: Secondary | ICD-10-CM

## 2016-07-31 ENCOUNTER — Ambulatory Visit (INDEPENDENT_AMBULATORY_CARE_PROVIDER_SITE_OTHER): Payer: BLUE CROSS/BLUE SHIELD | Admitting: Medical

## 2016-07-31 ENCOUNTER — Encounter: Payer: Self-pay | Admitting: Medical

## 2016-07-31 VITALS — BP 124/74 | HR 76 | Temp 98.2°F | Resp 16 | Ht 64.0 in | Wt 127.6 lb

## 2016-07-31 DIAGNOSIS — M545 Low back pain: Secondary | ICD-10-CM | POA: Diagnosis not present

## 2016-07-31 DIAGNOSIS — F419 Anxiety disorder, unspecified: Secondary | ICD-10-CM | POA: Diagnosis not present

## 2016-07-31 DIAGNOSIS — R5383 Other fatigue: Secondary | ICD-10-CM | POA: Diagnosis not present

## 2016-07-31 DIAGNOSIS — S46811D Strain of other muscles, fascia and tendons at shoulder and upper arm level, right arm, subsequent encounter: Secondary | ICD-10-CM

## 2016-07-31 DIAGNOSIS — Z833 Family history of diabetes mellitus: Secondary | ICD-10-CM | POA: Diagnosis not present

## 2016-07-31 MED ORDER — GABAPENTIN 100 MG PO CAPS: 100.0000 mg | ORAL_CAPSULE | Freq: Three times a day (TID) | ORAL | 0 refills | Status: DC

## 2016-07-31 MED ORDER — CYCLOBENZAPRINE HCL 5 MG PO TABS: 5.0000 mg | ORAL_TABLET | Freq: Every day | ORAL | 0 refills | Status: AC

## 2016-07-31 MED ORDER — SERTRALINE HCL 50 MG PO TABS: 50.0000 mg | ORAL_TABLET | Freq: Every day | ORAL | 3 refills | Status: AC

## 2016-07-31 NOTE — Progress Notes (Signed)
Pre visit review using our clinic review tool, if applicable. No additional management support is needed unless otherwise documented below in the visit note. 
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
## 2016-08-01 LAB — COMPREHENSIVE METABOLIC PANEL
ALK PHOS: 56 U/L (ref 39–117)
ALT: 17 U/L (ref 0–35)
AST: 25 U/L (ref 0–37)
Albumin: 4.2 g/dL (ref 3.5–5.2)
BILIRUBIN TOTAL: 0.4 mg/dL (ref 0.2–1.2)
BUN: 9 mg/dL (ref 6–23)
CALCIUM: 9.4 mg/dL (ref 8.4–10.5)
CO2: 31 meq/L (ref 19–32)
Chloride: 105 mEq/L (ref 96–112)
Creatinine, Ser: 0.58 mg/dL (ref 0.40–1.20)
GFR: 123.91 mL/min (ref >60.00)
GLUCOSE: 96 mg/dL (ref 70–99)
POTASSIUM: 4.4 meq/L (ref 3.5–5.1)
Sodium: 139 mEq/L (ref 135–145)
TOTAL PROTEIN: 6.7 g/dL (ref 6.0–8.3)

## 2016-08-01 LAB — HEMOGLOBIN A1C: HEMOGLOBIN A1C: 5.3 % (ref 4.6–6.5)

## 2016-08-14 ENCOUNTER — Telehealth: Payer: Self-pay | Admitting: Medical

## 2016-08-14 MED ORDER — CLONAZEPAM 0.5 MG PO TABS: 0.5000 mg | ORAL_TABLET | Freq: Two times a day (BID) | ORAL | 2 refills | Status: DC | PRN

## 2016-08-14 NOTE — Telephone Encounter (Signed)
Notified pt rx faxed to [facility]

## 2016-08-14 NOTE — Telephone Encounter (Signed)
Refilled pt klonopin. You can fax it to her pharmacy.

## 2016-08-14 NOTE — Telephone Encounter (Signed)
Self. Refill request for Ollen Barges: [phone]  Pharmacy: [facility]- [place], [place] - [address]

## 2016-08-14 NOTE — Telephone Encounter (Addendum)
Refill Clonazepam  Last RX:05/16/16 Last OV:07/31/16 Next OV:09/04/16 UDS:03/06/16 CSC: 07/31/16

## 2016-09-04 ENCOUNTER — Ambulatory Visit: Payer: BLUE CROSS/BLUE SHIELD | Admitting: Medical

## 2016-09-07 ENCOUNTER — Other Ambulatory Visit: Payer: Self-pay | Admitting: Medical

## 2016-09-25 ENCOUNTER — Encounter: Payer: Self-pay | Admitting: Gynecology

## 2016-10-20 ENCOUNTER — Other Ambulatory Visit: Payer: Self-pay | Admitting: Medical

## 2016-10-28 ENCOUNTER — Telehealth: Payer: Self-pay | Admitting: Medical

## 2016-10-28 NOTE — Telephone Encounter (Signed)
Caller name: Relationship to patient: Self Can be reached: [phone]  Pharmacy:  [facility]- [place], [place] - [address] [phone] (Phone) [phone] (Fax)     Reason for call: Refill gabapentin (NEURONTIN) 100 MG capsule  clonazePAM (KLONOPIN) 0.5 MG tablet

## 2016-10-29 NOTE — Telephone Encounter (Signed)
Left pt a message to call back. 

## 2016-11-05 ENCOUNTER — Telehealth: Payer: Self-pay | Admitting: Medical

## 2016-11-05 NOTE — Telephone Encounter (Signed)
Relation to GN:FAOZpt:self Call back number:[phone] [facility] [place], [place] - [address]  Reason for call:  Patient requesting a refill clonazePAM (KLONOPIN) 0.5 MG tablet

## 2016-11-06 ENCOUNTER — Other Ambulatory Visit: Payer: Self-pay | Admitting: Medical

## 2016-11-06 MED ORDER — CLONAZEPAM 0.5 MG PO TABS: 0.5000 mg | ORAL_TABLET | Freq: Two times a day (BID) | ORAL | 2 refills | Status: DC | PRN

## 2016-11-06 NOTE — Telephone Encounter (Signed)
Pt due for follow up please call and schedule appointment.    Refill Request: Clonazepam  Last RX:08/14/16 Last OV:07/31/16 Next WU:[id number]:None Scheduled  UDS:06/06/16 CSC:07/31/16

## 2016-11-06 NOTE — Telephone Encounter (Signed)
Notified pt rx is ready for pick up and due for uds.

## 2016-11-08 ENCOUNTER — Encounter: Payer: Self-pay | Admitting: Medical

## 2016-11-24 ENCOUNTER — Telehealth: Payer: Self-pay | Admitting: Medical

## 2016-11-24 NOTE — Telephone Encounter (Signed)
Notify pt that that her drug screen came up positive for marijuana. She also did not have clonazepam in her system. She should of had that in her system if she had been taking it twice a day as I had written as she expressed she needed. I had written clonazepam 60 tabs on 08-14-2016 with 2 refills. Let her know currently am strongly considering/leaing toward prescribing non benzodiazepene option(no futher clonazepm) in light of + marijuana and no clonazepam in her system. Notify pt.   Also put flag/warning in chart that she needs office visit to discuss further refill of any narcotic. Also will need 30 minute that day.

## 2016-11-25 ENCOUNTER — Other Ambulatory Visit: Payer: Self-pay | Admitting: Medical

## 2016-11-26 NOTE — Telephone Encounter (Signed)
Called patient and left a message for call back  

## 2016-12-05 NOTE — Telephone Encounter (Signed)
Sent patient a MyChart message

## 2017-01-14 ENCOUNTER — Other Ambulatory Visit: Payer: Self-pay | Admitting: Medical

## 2017-01-16 NOTE — Telephone Encounter (Signed)
Pt due for follow please call and schedule appointment.  

## 2017-01-30 ENCOUNTER — Telehealth: Payer: Self-pay | Admitting: Medical

## 2017-01-30 MED ORDER — CLONAZEPAM 0.5 MG PO TABS: 0.5000 mg | ORAL_TABLET | Freq: Two times a day (BID) | ORAL | 2 refills | Status: DC | PRN

## 2017-01-30 NOTE — Telephone Encounter (Signed)
Relation to ZO:XWRU Call back number:[phone] Pharmacy: [facility]- [place], [place] - [address]  Reason for call:  Patient requesting 90 day supply clonazePAM (KLONOPIN) 0.5 MG tablet

## 2017-01-30 NOTE — Telephone Encounter (Signed)
The database that we pulled from [place] stated she was also on Suboxone. I was not aware of that and would you call patient and let her know that I can't give her any more clonazepam. Suboxone and clonazepam combination can be a lethal combination.  So advise patient she should ask this place where she gets Suboxone if they would write??   On review it looks like it was on her list as historical provider but I was not aware that he was previously on it when I last saw her?  On previous database reviews I don't remember ever seeing Suboxone. On review of prior prescriptions today I don't see that she's had in the past? But it does show that she got a prescription on 01/28/2017. So maybe she just started 2 days ago?  Note I shreaded the prescription that you had present for me to sign.

## 2017-01-30 NOTE — Telephone Encounter (Signed)
Refill Request: Clonazepam  Last RX:11/06/16 Last OV:07/31/16 Next NW:GNFA on schedule  UDS:11/08/16 CSC:07/31/16

## 2017-01-30 NOTE — Telephone Encounter (Signed)
Shredded patient's clonazepam prescription.

## 2017-01-31 NOTE — Telephone Encounter (Signed)
Notified pt of message below. Pt states she will get her other doctor to prescribe it for her.

## 2017-02-03 ENCOUNTER — Other Ambulatory Visit: Payer: Self-pay | Admitting: Medical

## 2017-02-06 NOTE — Telephone Encounter (Signed)
Pt called in to see if provider would give her a few pills until her apt. Pt says that she has been taking medication for years and need it. Pt already have a pt scheduled.

## 2017-02-07 MED ORDER — CLONAZEPAM 0.5 MG PO TABS: 0.5000 mg | ORAL_TABLET | Freq: Two times a day (BID) | ORAL | 0 refills | Status: DC | PRN

## 2017-02-07 NOTE — Telephone Encounter (Signed)
Who is patient going to see? Is she scheduled to see someone? She wants clonazepam. I would recommend that she get clinic who is prescribing the suboxone to give.

## 2017-02-07 NOTE — Telephone Encounter (Signed)
Since has appointment on 02-13-2017 with [facility] will give 5 more days of clonazepam. But won't give further.

## 2017-02-07 NOTE — Addendum Note (Signed)
Addended by: Gwenevere Abbot on: 02/07/2017 05:08 PM   Modules accepted: Orders

## 2017-02-07 NOTE — Telephone Encounter (Signed)
Pt has appt at [facility] 02/13/17. They told her to request clonazepam from [facility] to hold her over until that appt. Call pt.

## 2017-02-21 ENCOUNTER — Other Ambulatory Visit: Payer: Self-pay | Admitting: Medical

## 2017-02-21 NOTE — Telephone Encounter (Signed)
Pt will need appt/was advised to F/U in the spring/thx dmf

## 2017-02-25 ENCOUNTER — Telehealth: Payer: Self-pay | Admitting: Medical

## 2017-02-25 NOTE — Telephone Encounter (Signed)
Pt is getting clonazepam from another clinic. That is fine as I  did not want to prescribe it as she is on suboxone. She was seeing me very rarely for clonzepam. Would you call her and asked does she consider provider who is getting her clonazepam her pcp. If so then have front take me off as her pcp.

## 2017-03-05 NOTE — Telephone Encounter (Signed)
Spoke wit pt. Pt states she has been off clonazepam for 7 days suboxone clinic can't give it to her. Pt states she really needs it. Please Advise.

## 2017-03-06 NOTE — Telephone Encounter (Signed)
Please call and schedule pt

## 2017-03-06 NOTE — Telephone Encounter (Signed)
I can't write the clonazepam either for the same reason they won't write. Sorry.   What about my question regarding does she have a new pcp? Does she plan on seeing more routine care other than for anxiety. If so recommend she schedule for fasting CPE?

## 2017-03-06 NOTE — Addendum Note (Signed)
Addended by: Gwenevere AbbotSAGUIER, Reason Helzer M on: 03/06/2017 08:34 AM   Modules accepted: Orders

## 2017-10-13 IMAGING — US US TRANSVAGINAL NON-OB
1 series · 13 of 25 positions shown · non-contrast
Comparison: CT scan of the abdomen and pelvis of April 11, 2012
and pelvic ultrasound February 28, 2011

CLINICAL DATA: Menorrhagia with heavy bleeding every 2 weeks for
the past 2 months. History of previous Cesarean sections. Onset of
last menstrual period was April 06, 2016

EXAM:
TRANSABDOMINAL AND TRANSVAGINAL ULTRASOUND OF PELVIS
TECHNIQUE: Both transabdominal and transvaginal ultrasound examinations of the
pelvis were performed. Transabdominal technique was performed for
global imaging of the pelvis including uterus, ovaries, adnexal
regions, and pelvic cul-de-sac. It was necessary to proceed with
endovaginal exam following the transabdominal exam to visualize the
uterus, endometrium, ovaries, and adnexal structures..

[Series 1: us transvaginal non-ob · 0.24mm/px · 13 of 41 slices shown]
[im 1/41]
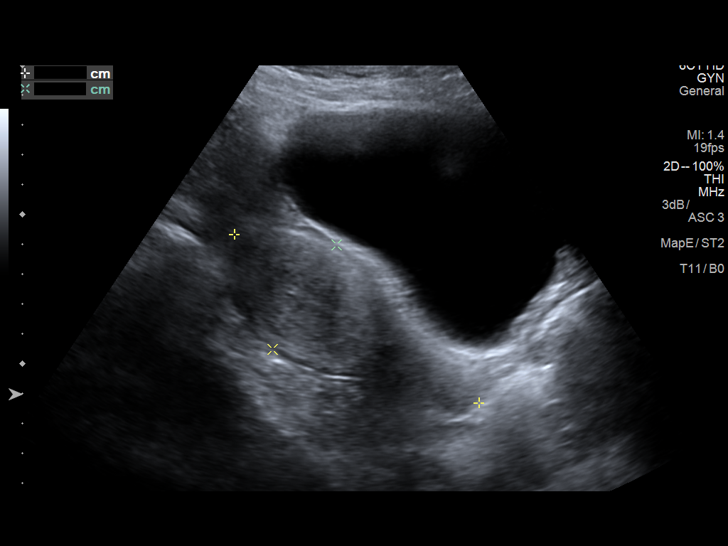
[im 4/41]
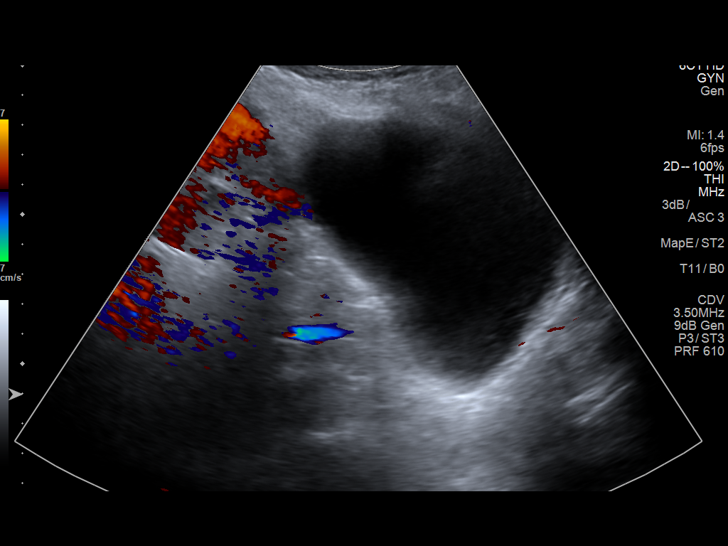
[im 7/41]
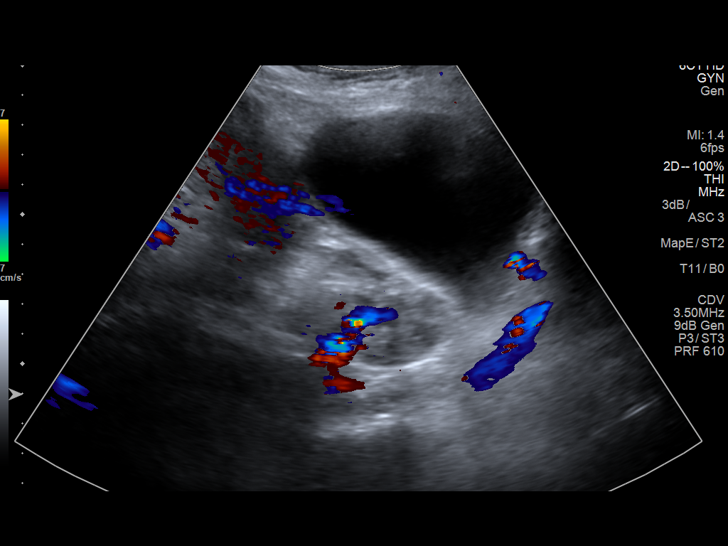
[im 11/41]
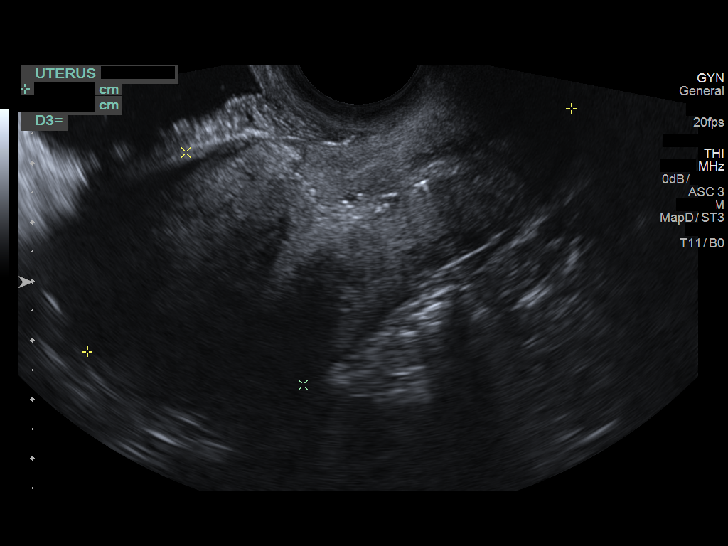
[im 14/41]
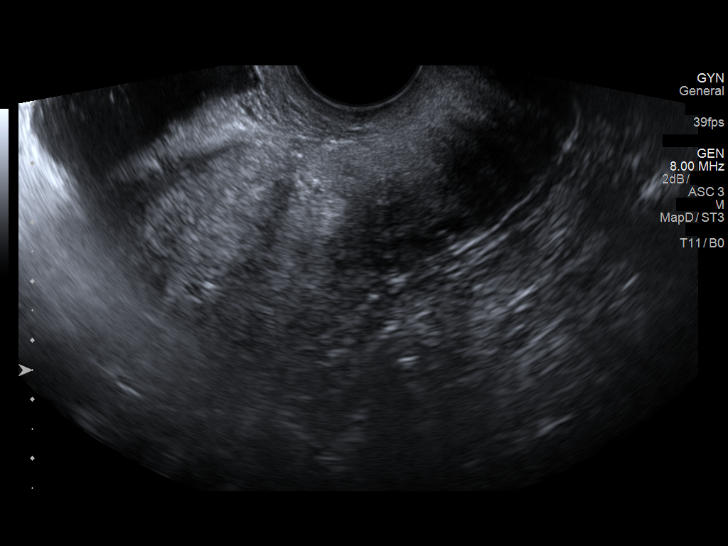
[im 17/41]
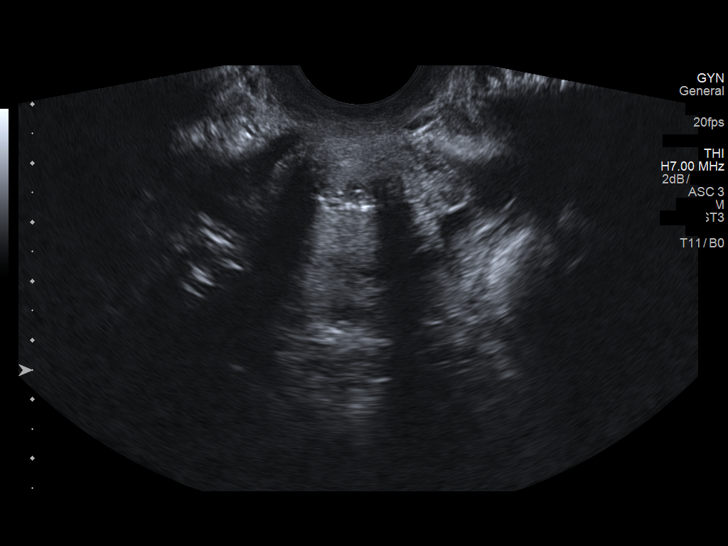
[im 21/41]
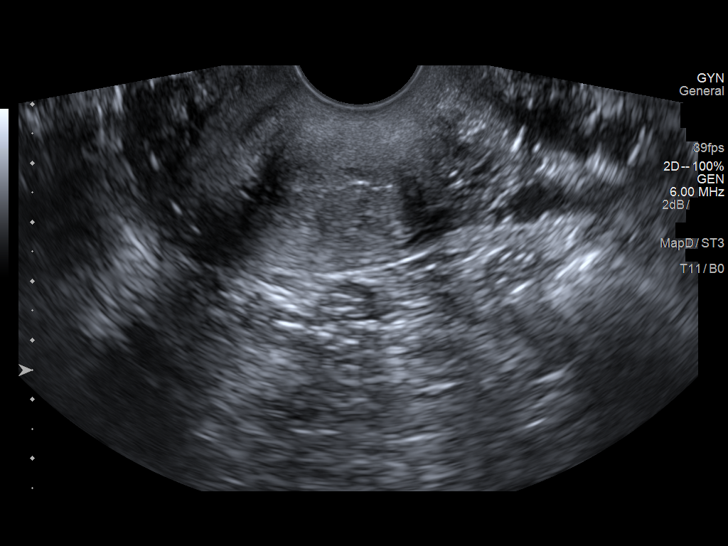
[im 24/41]
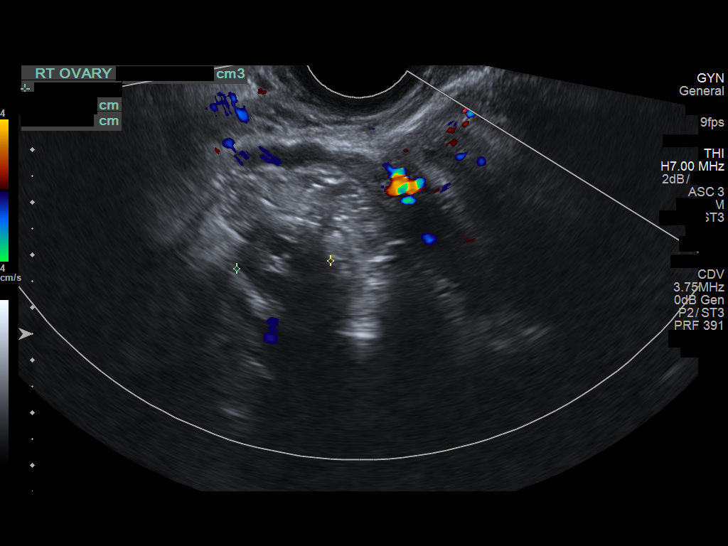
[im 27/41]
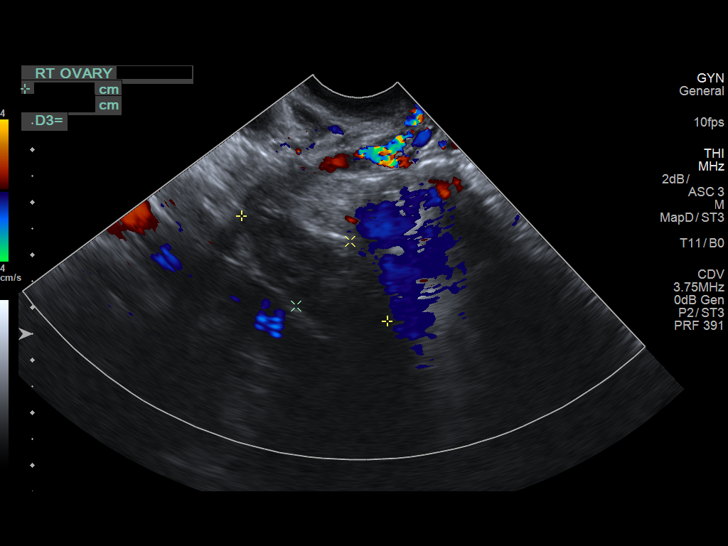
[im 31/41]
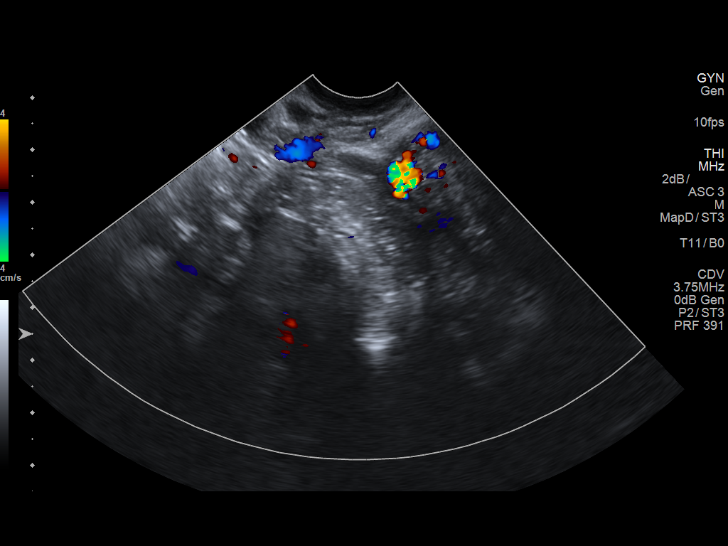
[im 34/41]
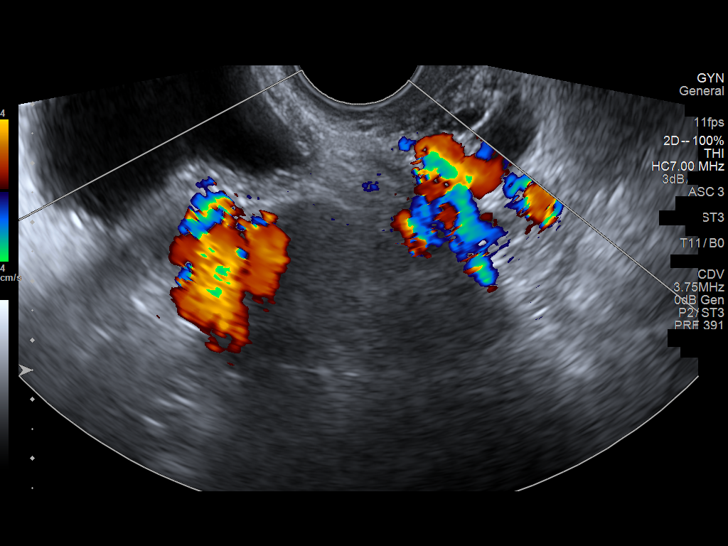
[im 37/41]
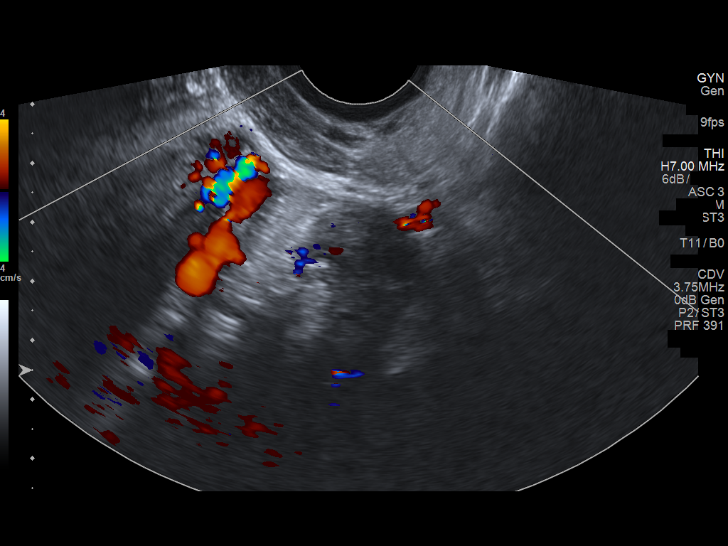
[im 41/41]
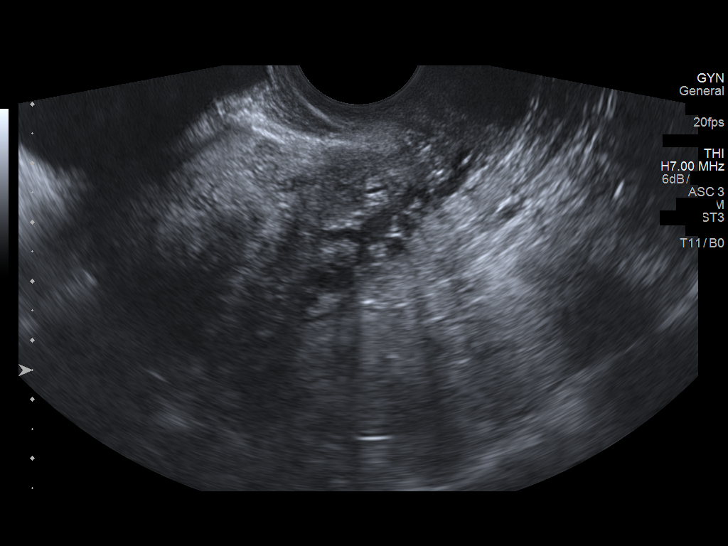

[13 of 25 positions shown; findings below may reference images not displayed]

FINDINGS: Uterus

Measurements: 9.2 x 4.4 x 5.6 cm. The myometrial echotexture is
heterogeneous. No discrete fibroids are observed however.

Endometrium

Thickness: 3 mm.  No focal abnormality visualized.

Right ovary

Measurements: Approximate measurements are 3.4 x 1.6 x 1.9 cm. Bowel
gas limits visualization of the ovarian parenchyma.

Left ovary

Measurements: 3.3 x 2.4 x 2.0 cm.. Normal appearance/no adnexal
mass.

Other findings

No abnormal free fluid.  No adnexal masses are observed.
IMPRESSION: 1. Heterogeneous uterine echotexture without discrete fibroids.
Normal appearance of the endometrium. If bleeding remains
unresponsive to hormonal or medical therapy, sonohysterogram should
be considered for focal lesion work-up. (Ref: Radiological
Reasoning: Algorithmic Workup of Abnormal Vaginal Bleeding with
Endovaginal Sonography and Sonohysterography. [id number]; [id number].)

2. Limited visualization of the right ovary. Normal appearance of
the left ovary and of the adnexal regions. No free pelvic fluid.

## 2018-02-14 IMAGING — DX DG LUMBAR SPINE 2-3V
3 series · 3 of 3 positions shown · non-contrast
Comparison: Lumbar spine films of 01/25/2015

CLINICAL DATA: Right-sided low back pain for many years, no acute
injury

EXAM:
LUMBAR SPINE - 2-3 VIEW

[l-spine ap]
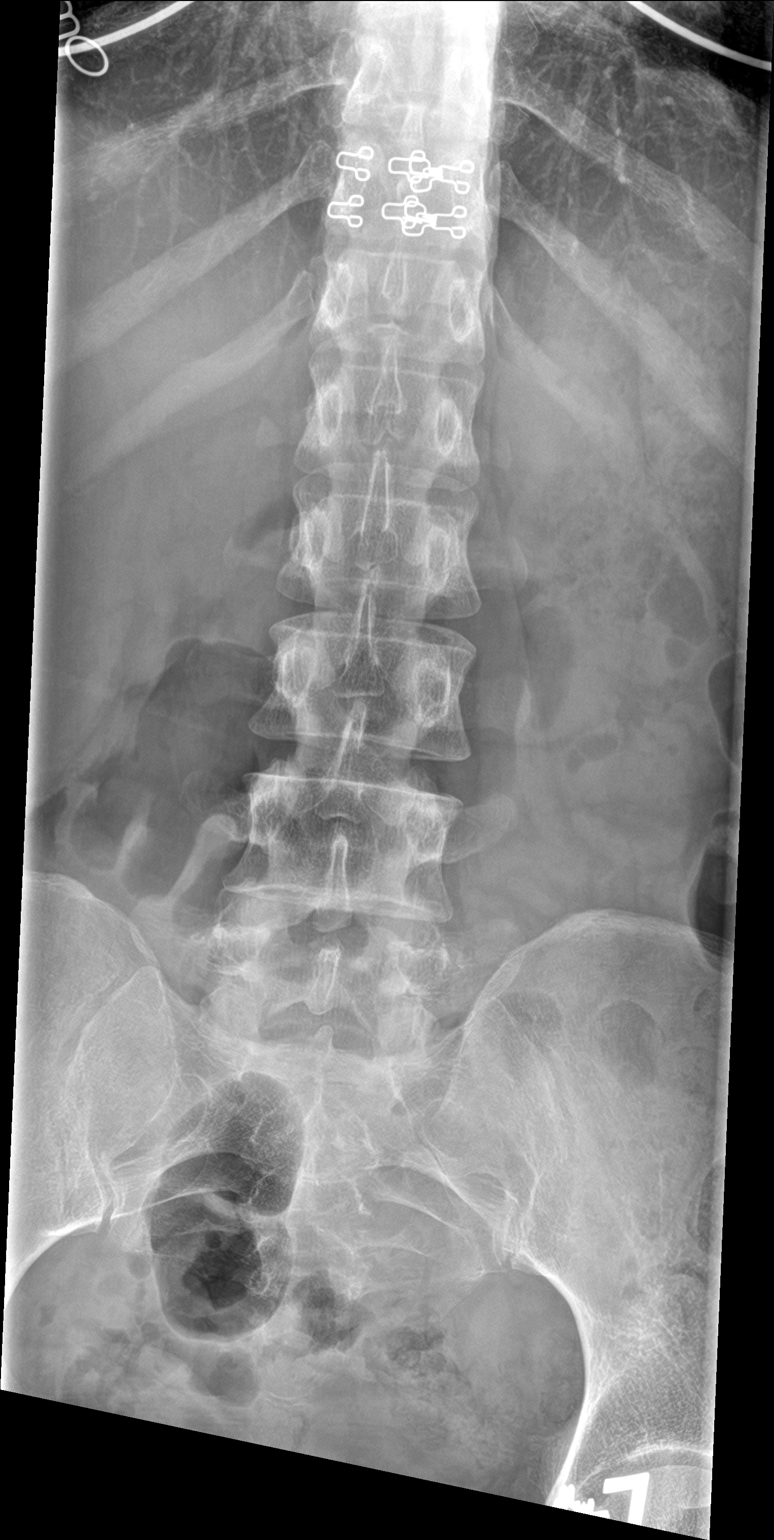

[l-spine lat]
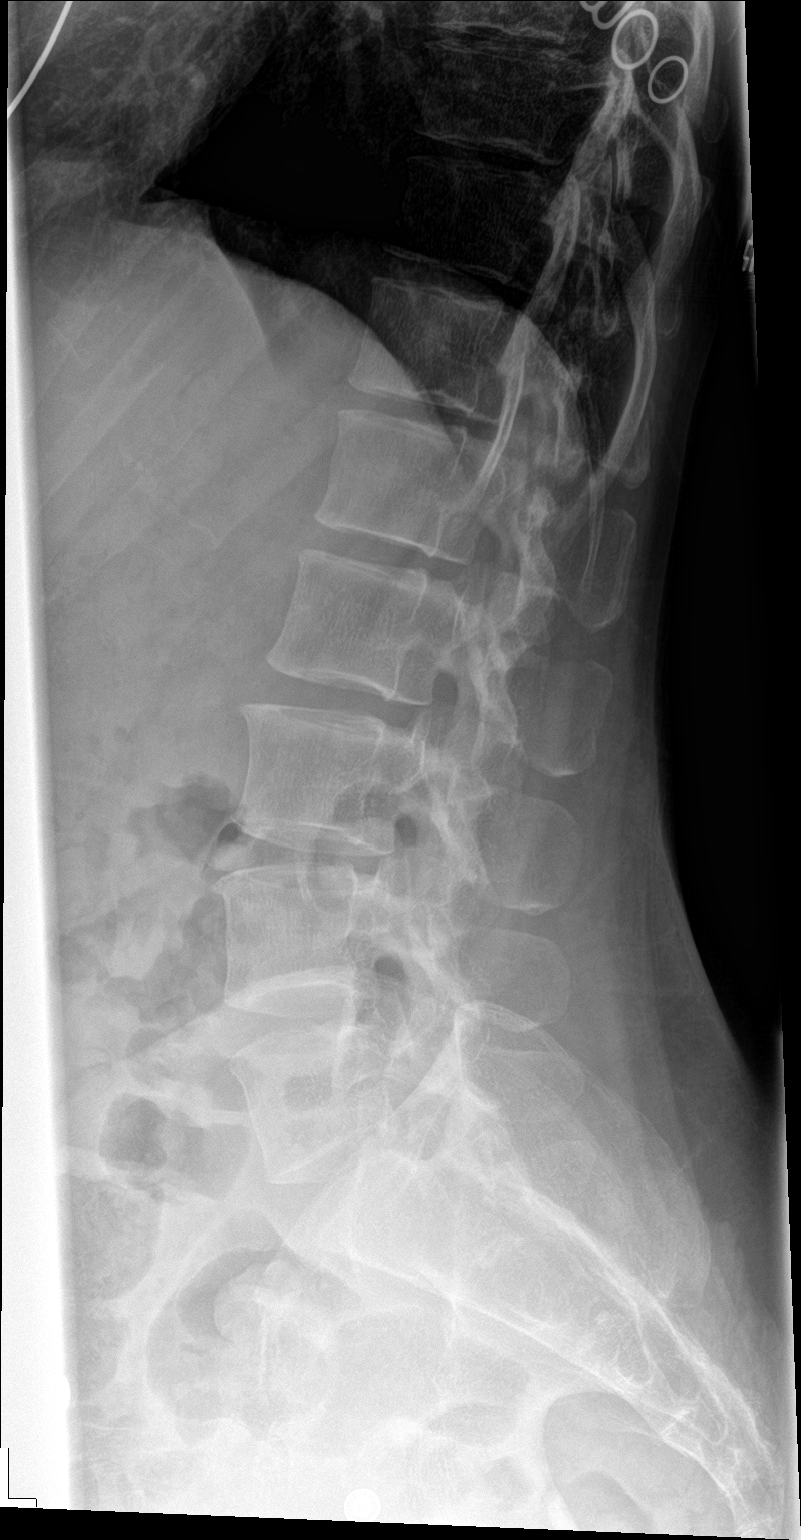

[l-spine spot]
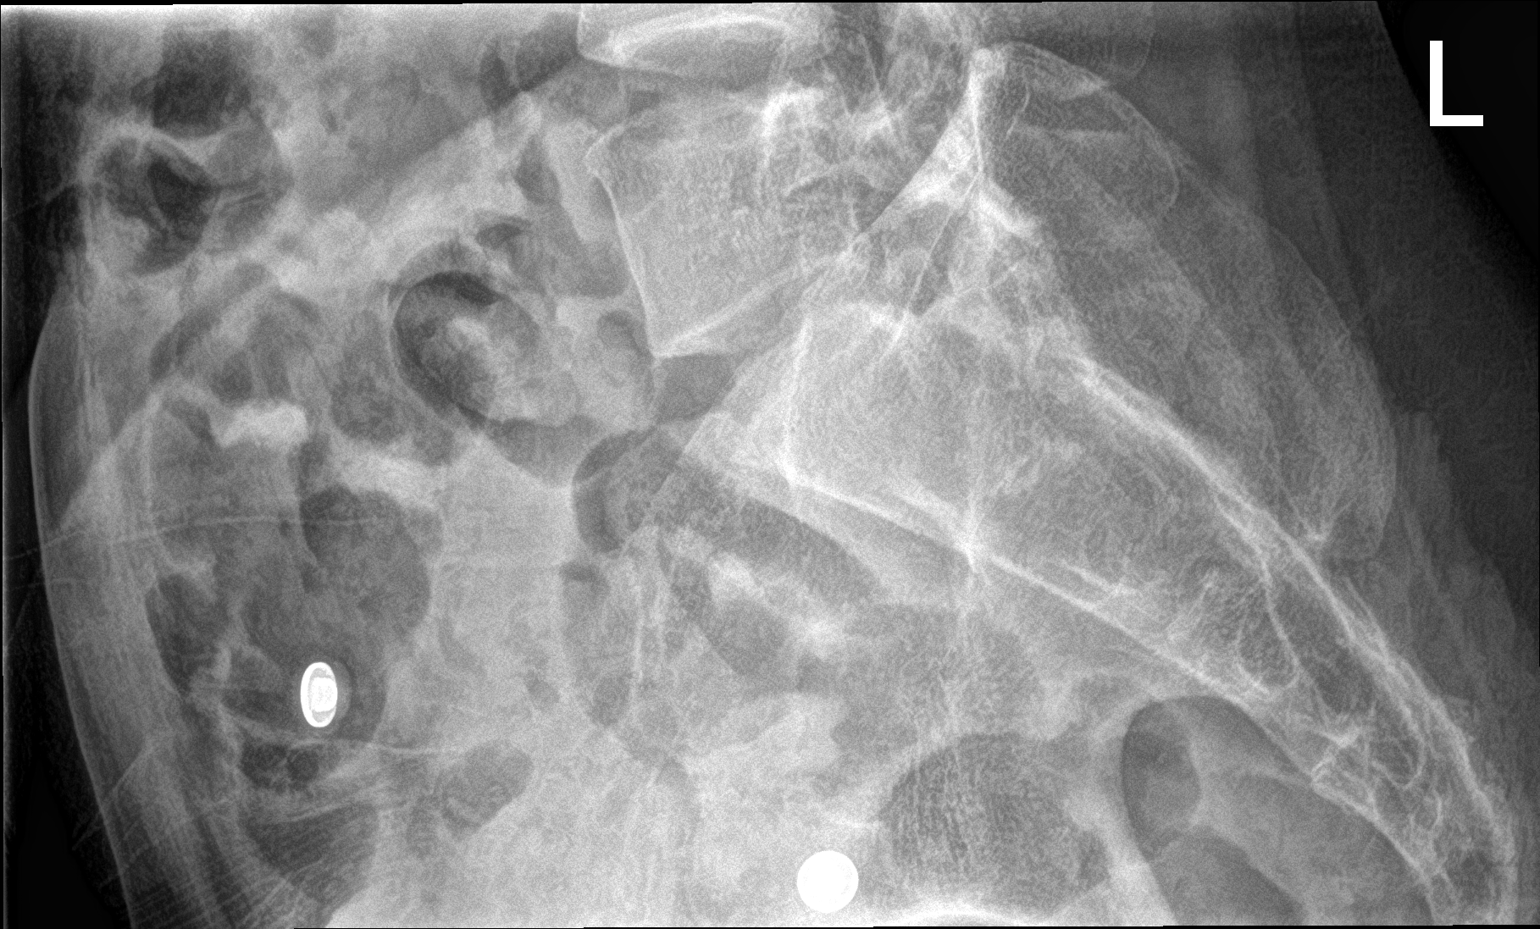

[3 of 3 positions shown; findings below may reference images not displayed]

FINDINGS: The lumbar vertebrae remain in normal alignment. Intervertebral disc
spaces appear normal. No compression deformity is seen. The SI
joints appear corticated.
IMPRESSION: Normal alignment with normal intervertebral disc spaces. No acute
abnormality.

## 2019-02-02 ENCOUNTER — Encounter: Payer: Self-pay | Admitting: Gynecology
